# Patient Record
Sex: Male | Born: 1985 | Race: Black or African American | Hispanic: No | Smoking: Never smoker
Health system: Southern US, Community
[De-identification: ages and names within clinical notes are randomized; demographics above are authoritative.]

## PROBLEM LIST (undated history)

## (undated) DIAGNOSIS — Z789 Other specified health status: Secondary | ICD-10-CM

## (undated) DIAGNOSIS — J45909 Unspecified asthma, uncomplicated: Secondary | ICD-10-CM

## (undated) HISTORY — PX: SHOULDER SURGERY: SHX246

## (undated) HISTORY — PX: CERVICAL DISC ARTHROPLASTY: SHX587

---

## 2019-11-05 ENCOUNTER — Ambulatory Visit (HOSPITAL_COMMUNITY)
Admission: EM | Admit: 2019-11-05 | Discharge: 2019-11-05 | Disposition: A | Discharge status: Home / Self Care | Payer: No Typology Code available for payment source | Attending: Family Medicine | Admitting: Family Medicine

## 2019-11-05 ENCOUNTER — Encounter (HOSPITAL_COMMUNITY): Payer: Self-pay

## 2019-11-05 ENCOUNTER — Other Ambulatory Visit: Payer: Self-pay

## 2019-11-05 DIAGNOSIS — R110 Nausea: Secondary | ICD-10-CM | POA: Insufficient documentation

## 2019-11-05 DIAGNOSIS — R0981 Nasal congestion: Secondary | ICD-10-CM | POA: Diagnosis not present

## 2019-11-05 DIAGNOSIS — R519 Headache, unspecified: Secondary | ICD-10-CM | POA: Diagnosis present

## 2019-11-05 DIAGNOSIS — J3489 Other specified disorders of nose and nasal sinuses: Secondary | ICD-10-CM | POA: Diagnosis not present

## 2019-11-05 DIAGNOSIS — R42 Dizziness and giddiness: Secondary | ICD-10-CM | POA: Diagnosis not present

## 2019-11-05 DIAGNOSIS — Z20822 Contact with and (suspected) exposure to covid-19: Secondary | ICD-10-CM | POA: Diagnosis not present

## 2019-11-05 NOTE — ED Triage Notes (Signed)
Pt presents with ongoing headache, fatigue, nausea, and dizziness X 2 days.

## 2019-11-05 NOTE — Discharge Instructions (Addendum)
Recommend take Mucinex-D every 12 hours as needed for congestion and sinus pain. Continue Tylenol 1000mg  every 8 hours as needed for headache- may alternate with Ibuprofen 600mg  every 6 hours as needed. Continue to push fluids to help loosen up mucus in chest and head. Continue to use OTC Nasal spray for allergies/congestion. Follow-up in 3 to 4 days if not improving.

## 2019-11-05 NOTE — ED Provider Notes (Signed)
[facility]    CSN: [id number] Arrival date & time: 11/05/19  1508      History   Chief Complaint Chief Complaint  Patient presents with  . Headache  . Dizziness  . Fatigue  . Nausea    HPI Benjamin Bowen is a 34 y.o. male.   34 year old male presents with headache, sinus pressure, fatigue, dizziness, and nausea that started 2 days ago. Also having thicker clear to yellowish mucus draining from nose and down into his chest. Has decreased appetite but no vomiting or diarrhea. No change in taste or smell. Has been taking Tylenol with some relief. 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 Has received the Moderna vaccine- 2 doses. Has also been taking OTC antihistamine and nasal sprays with minimal relief. No chronic health issues. Takes no daily medication.   The history is provided by the patient.    History reviewed. No pertinent past medical history.  There are no problems to display for this patient.   Past Surgical History:  Procedure Laterality Date  . SHOULDER SURGERY         Home Medications    Prior to Admission medications   Not on File    Family History Family History  Family history unknown: Yes    Social History Social History   Tobacco Use  . Smoking status: Never Smoker  Substance Use Topics  . Alcohol use: Not on file  . Drug use: Not on file     Allergies   Patient has no known allergies.   Review of Systems Review of Systems  Constitutional: Positive for activity change, appetite change, chills and fatigue. Negative for diaphoresis and fever.  HENT: Positive for congestion, ear pain (pressure left worse than right), postnasal drip, rhinorrhea, sinus pressure and sinus pain. Negative for ear discharge, facial swelling, mouth sores, nosebleeds, sore throat and trouble swallowing.   Eyes: Positive for photophobia. Negative for pain,  discharge, redness, itching and visual disturbance.  Respiratory: Positive for cough. Negative for chest tightness, shortness of breath and wheezing.   Gastrointestinal: Positive for nausea. Negative for diarrhea and vomiting.  Musculoskeletal: Positive for myalgias. Negative for back pain, neck pain and neck stiffness.  Skin: Negative for color change, rash and wound.  Allergic/Immunologic: Positive for environmental allergies. Negative for food allergies and immunocompromised state.  Neurological: Positive for dizziness, light-headedness and headaches. Negative for tremors, seizures, syncope, facial asymmetry, weakness and numbness.  Hematological: Negative for adenopathy. Does not bruise/bleed easily.     Physical Exam Triage Vital Signs ED Triage Vitals  Enc Vitals Group     BP 11/05/19 1700 127/72     Pulse Rate 11/05/19 1700 75     Resp 11/05/19 1700 17     Temp 11/05/19 1700 99.4 F (37.4 C)     Temp Source 11/05/19 1700 Oral     SpO2 11/05/19 1700 100 %     Weight --      Height --      Head Circumference --      Peak Flow --      Pain Score 11/05/19 1657 6     Pain Loc --      Pain Edu? --      Excl. in [place]? --    No data found.  Updated Vital Signs BP 127/72 (BP Location: Left Arm)   Pulse 75  Temp 99.4 F (37.4 C) (Oral)   Resp 17   SpO2 100%   Visual Acuity Right Eye Distance:   Left Eye Distance:   Bilateral Distance:    Right Eye Near:   Left Eye Near:    Bilateral Near:     Physical Exam Vitals and nursing note reviewed.  Constitutional:      General: He is awake. He is not in acute distress.    Appearance: He is well-developed and well-groomed. He is ill-appearing.     Comments: He is sitting comfortably on the exam table in no acute distress but appears ill.   HENT:     Head: Normocephalic and atraumatic.     Right Ear: Hearing, ear canal and external ear normal. No drainage. A middle ear effusion is present. Tympanic membrane is bulging.  Tympanic membrane is not injected, erythematous or retracted.     Left Ear: Hearing, ear canal and external ear normal. No drainage. A middle ear effusion is present. Tympanic membrane is bulging. Tympanic membrane is not injected, erythematous or retracted.     Ears:     Comments: Fluid bubbles present behind both TM's- no redness.     Nose: Congestion and rhinorrhea present. Rhinorrhea is clear and purulent.     Right Turbinates: Enlarged.     Left Turbinates: Enlarged.     Right Sinus: Maxillary sinus tenderness and frontal sinus tenderness present.     Left Sinus: Maxillary sinus tenderness and frontal sinus tenderness present.     Mouth/Throat:     Lips: Pink.     Mouth: Mucous membranes are moist.     Pharynx: Uvula midline. Oropharyngeal exudate (slight clear to yellow post nasal drainage present) and posterior oropharyngeal erythema present. No pharyngeal swelling or uvula swelling.  Eyes:     Extraocular Movements: Extraocular movements intact.     Conjunctiva/sclera: Conjunctivae normal.  Cardiovascular:     Rate and Rhythm: Normal rate and regular rhythm.     Heart sounds: Normal heart sounds. No murmur.  Pulmonary:     Effort: Pulmonary effort is normal. No respiratory distress.     Breath sounds: Normal breath sounds and air entry. No stridor or decreased air movement. No decreased breath sounds, wheezing, rhonchi or rales.  Musculoskeletal:        General: Normal range of motion.     Cervical back: Normal range of motion and neck supple. No rigidity. No pain with movement or muscular tenderness.  Lymphadenopathy:     Cervical: Cervical adenopathy present.     Right cervical: Superficial cervical adenopathy present.     Left cervical: Superficial cervical adenopathy present.  Skin:    General: Skin is warm and dry.     Capillary Refill: Capillary refill takes less than 2 seconds.     Findings: No rash.  Neurological:     General: No focal deficit present.     Mental  Status: He is alert and oriented to person, place, and time.  Psychiatric:        Mood and Affect: Mood normal.        Behavior: Behavior normal. Behavior is cooperative.        Thought Content: Thought content normal.        Judgment: Judgment normal.      UC Treatments / Results  Labs (all labs ordered are listed, but only abnormal results are displayed) Labs Reviewed  SARS CORONAVIRUS 2 (TAT 6-24 HRS)    EKG  Radiology No results found.  Procedures Procedures (including critical care time)  Medications Ordered in UC Medications - No data to display  Initial Impression / Assessment and Plan / UC Course  I have reviewed the triage vital signs and the nursing notes.  Pertinent labs & imaging results that were available during my care of the patient were reviewed by me and considered in my medical decision making (see chart for details).    Reviewed with patient that he probably has a viral upper respiratory illness- possible COVID 19 but less likely due to being fully vaccinated. Headache most likely due to sinus congestion. Dizziness and nausea may be due to fluid behind tympanic membranes. Recommend take OTC Mucinex-D or similar medication to help with sinus congestion. May continue OTC antihistamine and nasal spray as directed. Increase fluids to help loosen up mucus. Continue Tylenol 1000mg  every 8 hours as needed- may alternate with Ibuprofen 600mg . Note written for work. Rest. Follow-up pending COVID 19 test results and in 3 to 4 days if not improving.   Final Clinical Impressions(s) / UC Diagnoses   Final diagnoses:  Sinus headache  Nasal congestion with rhinorrhea  Dizziness  Nausea     Discharge Instructions     Recommend take Mucinex-D every 12 hours as needed for congestion and sinus pain. Continue Tylenol 1000mg  every 8 hours as needed for headache- may alternate with Ibuprofen 600mg  every 6 hours as needed. Continue to push fluids to help loosen up mucus  in chest and head. Continue to use OTC Nasal spray for allergies/congestion. Follow-up in 3 to 4 days if not improving.     ED Prescriptions    None     PDMP not reviewed this encounter.   , NP 11/05/19 2356

## 2019-11-06 LAB — SARS CORONAVIRUS 2 (TAT 6-24 HRS): SARS Coronavirus 2: NEGATIVE

## 2019-12-10 ENCOUNTER — Other Ambulatory Visit: Payer: Self-pay | Admitting: Nurse Practitioner

## 2019-12-10 ENCOUNTER — Ambulatory Visit
Admission: RE | Admit: 2019-12-10 | Discharge: 2019-12-10 | Disposition: A | Discharge status: Home / Self Care | Payer: No Typology Code available for payment source | Source: Ambulatory Visit | Attending: Nurse Practitioner | Admitting: Nurse Practitioner

## 2019-12-10 DIAGNOSIS — Z021 Encounter for pre-employment examination: Secondary | ICD-10-CM

## 2020-01-31 ENCOUNTER — Emergency Department (HOSPITAL_COMMUNITY)
Admission: EM | Admit: 2020-01-31 | Discharge: 2020-01-31 | Disposition: A | Discharge status: Home / Self Care | Payer: No Typology Code available for payment source | Attending: Emergency Medicine | Admitting: Emergency Medicine

## 2020-01-31 ENCOUNTER — Other Ambulatory Visit: Payer: Self-pay

## 2020-01-31 ENCOUNTER — Emergency Department (HOSPITAL_COMMUNITY): Payer: No Typology Code available for payment source

## 2020-01-31 ENCOUNTER — Encounter (HOSPITAL_COMMUNITY): Payer: Self-pay | Admitting: Emergency Medicine

## 2020-01-31 DIAGNOSIS — R1030 Lower abdominal pain, unspecified: Secondary | ICD-10-CM | POA: Diagnosis not present

## 2020-01-31 DIAGNOSIS — Z5321 Procedure and treatment not carried out due to patient leaving prior to being seen by health care provider: Secondary | ICD-10-CM | POA: Insufficient documentation

## 2020-01-31 DIAGNOSIS — N50811 Right testicular pain: Secondary | ICD-10-CM | POA: Diagnosis present

## 2020-01-31 MED ORDER — TETANUS-DIPHTH-ACELL PERTUSSIS 5-2.5-18.5 LF-MCG/0.5 IM SUSP: 0.5000 mL | Freq: Once | INTRAMUSCULAR | Status: DC

## 2020-01-31 MED ORDER — AMOXICILLIN-POT CLAVULANATE 875-125 MG PO TABS: 1.0000 | ORAL_TABLET | Freq: Once | ORAL | Status: DC
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
## 2020-01-31 NOTE — ED Notes (Signed)
Pt called for room did not answer

## 2020-01-31 NOTE — ED Notes (Signed)
Patient called for room and called on personal cell phone with no response.

## 2020-03-05 ENCOUNTER — Ambulatory Visit (HOSPITAL_COMMUNITY)
Admission: EM | Admit: 2020-03-05 | Discharge: 2020-03-05 | Attending: Licensed Clinical Social Worker | Admitting: Licensed Clinical Social Worker

## 2020-03-05 ENCOUNTER — Emergency Department (HOSPITAL_COMMUNITY)
Admission: EM | Admit: 2020-03-05 | Discharge: 2020-03-07 | Disposition: A | Discharge status: Other Acute Inpatient Hospital | Payer: No Typology Code available for payment source | Attending: Emergency Medicine | Admitting: Emergency Medicine

## 2020-03-05 ENCOUNTER — Other Ambulatory Visit: Payer: Self-pay

## 2020-03-05 ENCOUNTER — Encounter (HOSPITAL_COMMUNITY): Payer: Self-pay | Admitting: Emergency Medicine

## 2020-03-05 DIAGNOSIS — Z20822 Contact with and (suspected) exposure to covid-19: Secondary | ICD-10-CM | POA: Diagnosis not present

## 2020-03-05 DIAGNOSIS — F322 Major depressive disorder, single episode, severe without psychotic features: Secondary | ICD-10-CM

## 2020-03-05 DIAGNOSIS — R45851 Suicidal ideations: Secondary | ICD-10-CM | POA: Diagnosis not present

## 2020-03-05 DIAGNOSIS — R258 Other abnormal involuntary movements: Secondary | ICD-10-CM | POA: Diagnosis not present

## 2020-03-05 DIAGNOSIS — F3289 Other specified depressive episodes: Secondary | ICD-10-CM | POA: Insufficient documentation

## 2020-03-05 DIAGNOSIS — Z046 Encounter for general psychiatric examination, requested by authority: Secondary | ICD-10-CM

## 2020-03-05 LAB — CBC
HCT: 44.3 % (ref 39.0–52.0)
Hemoglobin: 14.9 g/dL (ref 13.0–17.0)
MCH: 30 pg (ref 26.0–34.0)
MCHC: 33.6 g/dL (ref 30.0–36.0)
MCV: 89.3 fL (ref 80.0–100.0)
Platelets: 225 10*3/uL (ref 150–400)
RBC: 4.96 MIL/uL (ref 4.22–5.81)
RDW: 11.6 % (ref 11.5–15.5)
WBC: 8.2 10*3/uL (ref 4.0–10.5)
nRBC: 0 % (ref 0.0–0.2)

## 2020-03-05 LAB — COMPREHENSIVE METABOLIC PANEL
ALT: 29 U/L (ref 0–44)
AST: 23 U/L (ref 15–41)
Albumin: 4.4 g/dL (ref 3.5–5.0)
Alkaline Phosphatase: 41 U/L (ref 38–126)
Anion gap: 12 (ref 5–15)
BUN: 9 mg/dL (ref 6–20)
CO2: 27 mmol/L (ref 22–32)
Calcium: 9.4 mg/dL (ref 8.9–10.3)
Chloride: 101 mmol/L (ref 98–111)
Creatinine, Ser: 1.16 mg/dL (ref 0.61–1.24)
GFR calc Af Amer: >60 mL/min (ref >60)
GFR calc non Af Amer: >60 mL/min (ref >60)
Glucose, Bld: 99 mg/dL (ref 70–99)
Potassium: 3.8 mmol/L (ref 3.5–5.1)
Sodium: 140 mmol/L (ref 135–145)
Total Bilirubin: 1.7 mg/dL — ABNORMAL HIGH (ref 0.3–1.2)
Total Protein: 7.5 g/dL (ref 6.5–8.1)

## 2020-03-05 LAB — SALICYLATE LEVEL: Salicylate Lvl: <7 mg/dL — ABNORMAL LOW (ref 7.0–30.0)

## 2020-03-05 LAB — ETHANOL: Alcohol, Ethyl (B): <10 mg/dL (ref <10)

## 2020-03-05 LAB — ACETAMINOPHEN LEVEL: Acetaminophen (Tylenol), Serum: <10 ug/mL — ABNORMAL LOW (ref 10–30)
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
## 2020-03-05 NOTE — BH Assessment (Addendum)
Comprehensive Clinical Assessment (CCA) Note  03/05/2020 Benjamin Bowen [id number]  Visit Diagnosis:   MDD, recurrent, severe without sx of psychosis Disposition:[name], NP recommends IVC and inpt psychiatric tx  Pt presents to [facility] reporting thoughts of suicide following a break- up with his fiancee and disappointment of being disqualified from the [employer] due to anxiety attacks during training. Pt reports multiple symptoms of depression. Pt was petitioned for IVC due to reporting his guns were no longer in his home & that they were removed by his fiancee's father for pt's safety. Collateral phone contact was made with person pt had designated and verbally authorized for collateral contact (his ex-fiancee [name], [phone]) . Ms. Sheliah Hatch reports she is concerned for pt's safety from self-harm. She reports pt "absolutely has a plan and needs as much help as he can get." She reports pt made threats to shoot himself and held his handgun to his head. Pt reports/confirms he did receive the guns back in his home because he needed them for self-protection. Collateral reports shot gun was removed from pt's home today but they were not able to locate the handgun.   CCA Screening, Triage and Referral (STR)  Patient Reported Information How did you [place] about [place] [facility]  Referral name: [place]  What Is the Reason for Your Visit/Call Today? Depression sx and suicidal ideation following break up with fiancee & being disqualified from [employer] due to panic attacks  How Long Has This Been Causing You Problems? 1-6 months  What Do You Feel Would Help You the Most Today? Medication;Therapy   Have You Recently Been in Any Inpatient Treatment (Hospital/Detox/Crisis Center/28-Day Program)? No  Have You Ever Received Services From [employer] Before? Yes  Have You Recently Had Any Thoughts About Hurting Yourself? Yes  Are You Planning to  Commit Suicide/Harm Yourself At This time? No   Have you Recently Had Thoughts About Hurting Someone Karolee Ohs? No   Have You Used Any Alcohol or Drugs in the Past 24 Hours? Yes  How Long Ago Did You Use Drugs or Alcohol? 1200  What Did You Use and How Much? noon today. Pt reports drinking 3-4 x weekly; a pint to a fifth of liquor   Do You Currently Have a Therapist/Psychiatrist? No  Have You Been Recently Discharged From Any Office Practice or Programs? No  CCA Screening Triage Referral Assessment Type of Contact: Face-to-Face   Patient Reported Information Reviewed? Yes   Collateral Involvement: Luvenia Starch (ex-fiancee) [phone]  Is CPS involved or ever been involved? Never  Is APS involved or ever been involved? Never   Patient Determined To Be At Risk for Harm To Self or Others Based on Review of Patient Reported Information or Presenting Complaint? Yes, for Self-Harm   Location of Assessment: [facility]   Does Patient Present under Involuntary Commitment? No  [place] of Residence: [place]  Determination of Need: Emergent (2 hours)   Options For Referral: Outpatient Therapy;Medication Management;Inpatient Hospitalization   CCA Biopsychosocial  Intake/Chief Complaint:  CCA Intake With Chief Complaint CCA Part Two Date: 03/05/20 CCA Part Two Time: 1920 Chief Complaint/Presenting Problem: Pt presents to [facility] reporting thoughts of suicide following a break-up with his fiancee and disappointment of being disqualified from the [employer] due to anxiety attacks during training Patient's Currently Reported Symptoms/Problems: depression sx; suicide ideation Individual's Strengths: supportive family (mother, sisters) Type of Services Patient Feels Are Needed:  med mngt; therapy  Mental Health Symptoms Depression:  Depression: Change in energy/activity, Difficulty Concentrating, Fatigue, Hopelessness,  Increase/decrease in appetite, Irritability, Sleep (too much or little), Tearfulness, Worthlessness, Duration of symptoms greater than two weeks  Mania:     Anxiety:   Anxiety: Difficulty concentrating, Irritability, Tension, Worrying, Sleep, Fatigue, Restlessness  Psychosis:  Psychosis: None  Trauma:  Trauma: N/A  Obsessions:  Obsessions: N/A  Compulsions:  Compulsions: N/A  Inattention:  Inattention: N/A  Hyperactivity/Impulsivity:  Hyperactivity/Impulsivity: N/A  Oppositional/Defiant Behaviors:  Oppositional/Defiant Behaviors: N/A  Emotional Irregularity:  Emotional Irregularity: N/A  Other Mood/Personality Symptoms:      Mental Status Exam Appearance and self-care  Stature:  Stature: Average  Weight:  Weight: Average weight  Clothing:  Clothing: Casual  Grooming:  Grooming: Normal  Cosmetic use:  Cosmetic Use: None  Posture/gait:  Posture/Gait: Tense, Normal  Motor activity:  Motor Activity: Restless, Not Remarkable  Sensorium  Attention:  Attention: Normal  Concentration:  Concentration: Preoccupied  Orientation:  Orientation: X5  Recall/memory:  Recall/Memory: Normal  Affect and Mood  Affect:  Affect: Anxious, Appropriate  Mood:  Mood: Anxious, Angry, Irritable  Relating  Eye contact:  Eye Contact: Normal  Facial expression:  Facial Expression: Angry, Anxious, Tense, Sad, Responsive  Attitude toward examiner:  Attitude Toward Examiner: Cooperative  Thought and Language  Speech flow: Speech Flow: Clear and Coherent  Thought content:  Thought Content: Appropriate to Mood and Circumstances  Preoccupation:  Preoccupations: Guilt  Hallucinations:  Hallucinations: None  Organization:     Company secretary of Knowledge:  Fund of Knowledge: Good  Intelligence:  Intelligence: Average  Abstraction:  Abstraction: Normal  Judgement:  Judgement: Impaired  Reality Testing:  Reality Testing: Variable  Insight:  Insight: Poor  Decision Making:  Decision Making: Impulsive   Social Functioning  Social Maturity:  Social Maturity: Impulsive, Isolates, Responsible, Irresponsible  Social Judgement:  Social Judgement: Heedless, Normal, Victimized  Stress  Stressors:  Stressors: Family conflict, Grief/losses, Relationship  Coping Ability:  Coping Ability: Exhausted, Engineer, agricultural Deficits:  Skill Deficits: Decision making  Supports:  Supports: Family    Exercise/Diet: Exercise/Diet Do You Have Any Trouble Sleeping?: Yes Explanation of Sleeping Difficulties: hasn't slept well since deployment   CCA Employment/Education  Employment/Work Situation: Employment / Work Psychologist, occupational Employment situation:  ([employer] & recently disqualified from [employer] for panic attack) Has patient ever been in the [employer]?: Yes (Describe in comment)  Education: Education Is Patient Currently Attending School?: No   CCA Family/Childhood History  Family and Relationship History: Family history Are you sexually active?: Yes What is your sexual orientation?: hetero Does patient have children?: Yes How many children?: 3 How is patient's relationship with their children?: close  Childhood History:  Childhood History Does patient have siblings?: Yes Did patient suffer any verbal/emotional/physical/sexual abuse as a child?: No Did patient suffer from severe childhood neglect?: No Has patient ever been sexually abused/assaulted/raped as an adolescent or adult?: No Was the patient ever a victim of a crime or a disaster?: No  Child/Adolescent Assessment:     CCA Substance Use  Alcohol/Drug Use: Alcohol / Drug Use Pain Medications: denies History of alcohol / drug use?: Yes Substance #1 Name of Substance 1: alcohol 1 - Amount (size/oz): pint to a fifth of liquor 1 - Frequency: 3-4 x weekly 1 - Last Use / Amount: noon 03/05/2020    Keshara Kiger Suzan Nailer

## 2020-03-05 NOTE — ED Notes (Signed)
Pt doesn't want to change into purple scrubs or give up belongings.

## 2020-03-05 NOTE — Progress Notes (Signed)
Patients mother called inquiring about information after he has been seen.  Marva Panda, [phone]  Ladoris Gene MSW,LCSWA,LCASA Clinical Social Worker  [place] Disposition, CSW [phone] (cell)

## 2020-03-05 NOTE — ED Notes (Signed)
Pt still has his phone

## 2020-03-05 NOTE — ED Notes (Signed)
Pt served with IVC paperwork by [facility] & escorted from building. Pt calm & cooperative at that time but c/o being 'held against his will'.

## 2020-03-05 NOTE — ED Notes (Signed)
Patient belongings in locker # 17. 

## 2020-03-05 NOTE — ED Triage Notes (Signed)
Pt brought into ED under IVC by [employer].  Pt states he went for help and they IVCed him.  See IVC paperwork for further information.  Belonging were taken, pt was changed and wonded by security.  Pt is now calm and cooperative w/ staff.

## 2020-03-06 LAB — RESPIRATORY PANEL BY RT PCR (FLU A&B, COVID)
Influenza A by PCR: NEGATIVE
Influenza B by PCR: NEGATIVE
SARS Coronavirus 2 by RT PCR: NEGATIVE

## 2020-03-06 NOTE — ED Provider Notes (Signed)
[facility] EMERGENCY DEPARTMENT Provider Note   CSN: [id number] Arrival date & time: 03/05/20  2130     History Chief Complaint  Patient presents with  . IVC    Benjamin Bowen is a 34 y.o. male.  The history is provided by the patient and medical records.    34 y.o. M presenting to the ED from [facility] under IVC.  He apparently called support team earlier today and requested transport to facility for help.  Patient has had a lot of stress recently-- broken up with fiance and was released from the [employer] due to anxiety attacks during training.  He admits he is disappointed in himself.    Per IVC paperwork, he had guns in the home but they were removed earlier this week by fiance's father for safety.  Fiance did report earlier today he made threats of wanted to shoot himself and held a handgun to his head.  He has never made any suicidal statements or attempts on his life in the past.  Does drink alcohol regularly including earlier today, denies illicit drug use.    Patient has been recommended for IP treatment.  Sent here for medical clearance and is pending placement.  History reviewed. No pertinent past medical history.  There are no problems to display for this patient.   Past Surgical History:  Procedure Laterality Date  . SHOULDER SURGERY         Family History  Family history unknown: Yes    Social History   Tobacco Use  . Smoking status: Never Smoker  Substance Use Topics  . Alcohol use: Not on file  . Drug use: Not on file    Home Medications Prior to Admission medications   Not on File    Allergies    Patient has no known allergies.  Review of Systems   Review of Systems  Psychiatric/Behavioral: Positive for suicidal ideas.  All other systems reviewed and are negative.   Physical Exam Updated Vital Signs BP 138/84 (BP Location: Right Arm)   Pulse 72   Temp 97.9 F (36.6 C) (Oral)   Resp (!) 24   SpO2 100%    Physical Exam Vitals and nursing note reviewed.  Constitutional:      Appearance: He is well-developed.     Comments: Sleeping with blanket over head  HENT:     Head: Normocephalic and atraumatic.  Eyes:     Conjunctiva/sclera: Conjunctivae normal.     Pupils: Pupils are equal, round, and reactive to light.  Cardiovascular:     Rate and Rhythm: Normal rate and regular rhythm.     Heart sounds: Normal heart sounds.  Pulmonary:     Effort: Pulmonary effort is normal.     Breath sounds: Normal breath sounds.  Abdominal:     General: Bowel sounds are normal.     Palpations: Abdomen is soft.  Musculoskeletal:        General: Normal range of motion.     Cervical back: Normal range of motion.  Skin:    General: Skin is warm and dry.  Neurological:     Mental Status: He is alert and oriented to person, place, and time.  Psychiatric:     Comments: Seems depressed, limited eye contact     ED Results / Procedures / Treatments   Labs (all labs ordered are listed, but only abnormal results are displayed) Labs Reviewed  COMPREHENSIVE METABOLIC PANEL - Abnormal; Notable for the following components:  Result Value   Total Bilirubin 1.7 (*)    All other components within normal limits  SALICYLATE LEVEL - Abnormal; Notable for the following components:   Salicylate Lvl <7.0 (*)    All other components within normal limits  ACETAMINOPHEN LEVEL - Abnormal; Notable for the following components:   Acetaminophen (Tylenol), Serum <10 (*)    All other components within normal limits  RESPIRATORY PANEL BY RT PCR (FLU A&B, COVID)  ETHANOL  CBC  RAPID URINE DRUG SCREEN, HOSP PERFORMED    EKG None  Radiology No results found.  Procedures Procedures (including critical care time)  Medications Ordered in ED Medications - No data to display  ED Course  I have reviewed the triage vital signs and the nursing notes.  Pertinent labs & imaging results that were available during  my care of the patient were reviewed by me and considered in my medical decision making (see chart for details).    MDM Rules/Calculators/A&P  34 year old male presenting to the ED under IVC petitioned by staff at [facility].  Recent stressors including break up with fiance and being released from [employer] due to anxiety attacks.  He reportedly had a gun to his head earlier today and made suicidal statements to fiance.  He is calm/cooperative here. No medical concerns currently, labs reassuring.  RT panel is negative.  Medically cleared.  Awaiting IP psychiatric placement.  Holding orders in place.  Final Clinical Impression(s) / ED Diagnoses Final diagnoses:  Involuntary commitment    Rx / DC Orders ED Discharge Orders    None       Garlon Hatchet, PA-C 03/06/20 0531    Gilda Crease, MD 03/06/20 [phone]

## 2020-03-06 NOTE — Progress Notes (Signed)
[facility] requested additional information on patient and is considering accepting him.  Faxed additional information and will follow up.  Ladoris Gene MSW,LCSWA,LCASA Clinical Social Worker  [place] Disposition, CSW [phone] (cell)

## 2020-03-06 NOTE — ED Notes (Signed)
Patient using telephone  

## 2020-03-06 NOTE — Progress Notes (Signed)
Pt accepted to [facility] [address] [place], [place] [address]  Dr. Estill Cotta is the attending provider.    Call report to [phone]    Jacalyn Lefevre @ [facility]  ED notified.     Pt is IVC.    Pt may be transported by law enforcement   Pt scheduled  to arrive at [facility], Saturday 9/24 anytime after 7am.   Ladoris Gene MSW,LCSWA,LCASA Clinical Social Worker  [facility] Disposition, CSW [phone] (cell)

## 2020-03-06 NOTE — ED Provider Notes (Signed)
Emergency Medicine Observation Re-evaluation Note  Benjamin Bowen is a 34 y.o. male, seen on rounds today.  Pt initially presented to the ED for complaints of IVC Currently, the patient is calm and cooperative.  Physical Exam  BP 125/74 (BP Location: Right Arm)   Pulse 62   Temp 98.5 F (36.9 C) (Oral)   Resp 18   Ht 5\' 9"  (1.753 m)   Wt 90.7 kg   SpO2 98%   BMI 29.53 kg/m  Physical Exam  No diaphoresis.  No pallor.  Pulmonary: No increased work of breathing.  Speaks in full sentences without difficulty. No tachypnea.   Cardiac: Normal rate and regular.   Neurologic: Alert and oriented.  Moves all extremities without noted difficulty.  Psych: Calm and cooperative throughout several interactions with the patient.  ED Course / MDM  EKG:    I have reviewed the labs performed to date as well as medications administered while in observation.  No notable changes over the last 24 hours except patient appears to be more cooperative.  Plan  Current plan is for inpatient psych management. Patient is under full IVC at this time.   This patient required extensive review of notes of his intake as well as multiple interviews. At first glance, it appears as though patient was a walk-in at [facility] last night complaining of depression and need for counseling.  During the course of the interview the provider became suspicious that the patient may be a danger to himself, recommended inpatient management and placed the patient under IVC. Patient was then transferred via law enforcement to [facility] ED.  When I interviewed the patient, he seemed rather reasonable.  He communicated details of the key event that he thinks resulted in his IVC.  He states he was target shooting when he received a video chat from his significant other.  She delivered bad news to him during this conversation and patient still had the handgun in his hand.  He put his hands to either side of his head in a gesture of  frustration and emotion similar to holding his head in his hands.  He states because he was still holding handgun it looked as if he was placing the handgun to his head.    5:22 PM: I was able to speak with [facility], NP who interviewed the patient last night and placed him under IVC.  I wanted to see if she thought it would be reasonable to reevaluate the patient's IVC.  Inetta Fermo is quite hesitant based on her assessment last night. She reports patient was quite distraught and has so many risk factors for suddenly worsening depression and not only suicide, but suicide completion, that she was acutely concerned for his safety. He apparently ran from the facility and had to be chased by Inetta Fermo. She states if we want patient reevaluated, it might be best to have him reevaluated by a second provider after shift change at 8 pm.  8:50 PM: Spoke with Patent examiner, NP with [facility]. Discussed patient presentation. She states she thinks it would more appropriate for patient to be reevaluated during psych rounds in the morning.        [facility] 03/06/20 2232    2233, MD 03/07/20 03/09/20

## 2020-03-06 NOTE — Progress Notes (Signed)
Per Berneice Heinrich, NP patient meets inpatient criteria.  Following facilities have been contacted:    [facility]        [facility]        [facility]        [facility]        [facility]        [facility]        [facility]        [facility]        [facility]        [facility]        [facility]        [facility]        [facility]        [facility] (after [facility])        [facility]        [facility]       CSW will continue to follow up.  Ladoris Gene MSW,LCSWA,LCASA Clinical Social Worker  [place] Disposition, CSW [phone] (cell)

## 2020-03-06 NOTE — ED Notes (Signed)
While during inventory on patient belonging sealed plastic bag noted with rip in it. Took belongings to security and inventoried it in with them. Went over belonging sheet with patient and he agreed that everything was correct in the  bag.

## 2020-03-06 NOTE — ED Notes (Signed)
Sitter at bedside.

## 2020-03-07 ENCOUNTER — Other Ambulatory Visit: Payer: Self-pay

## 2020-03-07 ENCOUNTER — Encounter (HOSPITAL_COMMUNITY): Payer: Self-pay | Admitting: Psychiatry

## 2020-03-07 ENCOUNTER — Inpatient Hospital Stay (HOSPITAL_COMMUNITY)
Admission: AD | Admit: 2020-03-07 | Discharge: 2020-03-09 | DRG: 881 | Disposition: A | Discharge status: Home / Self Care | Attending: Psychiatry | Admitting: Psychiatry

## 2020-03-07 DIAGNOSIS — F329 Major depressive disorder, single episode, unspecified: Secondary | ICD-10-CM | POA: Diagnosis present

## 2020-03-07 DIAGNOSIS — F1220 Cannabis dependence, uncomplicated: Secondary | ICD-10-CM | POA: Diagnosis present

## 2020-03-07 DIAGNOSIS — G4700 Insomnia, unspecified: Secondary | ICD-10-CM | POA: Diagnosis present

## 2020-03-07 DIAGNOSIS — R45851 Suicidal ideations: Secondary | ICD-10-CM | POA: Diagnosis present

## 2020-03-07 DIAGNOSIS — F322 Major depressive disorder, single episode, severe without psychotic features: Secondary | ICD-10-CM

## 2020-03-07 DIAGNOSIS — Z20822 Contact with and (suspected) exposure to covid-19: Secondary | ICD-10-CM | POA: Diagnosis present

## 2020-03-07 HISTORY — DX: Other specified health status: Z78.9

## 2020-03-07 LAB — RAPID URINE DRUG SCREEN, HOSP PERFORMED
Amphetamines: NOT DETECTED
Barbiturates: NOT DETECTED
Benzodiazepines: NOT DETECTED
Cocaine: NOT DETECTED
Opiates: NOT DETECTED
Tetrahydrocannabinol: POSITIVE — AB

## 2020-03-07 MED ORDER — ACETAMINOPHEN 325 MG PO TABS
650.0000 mg | ORAL_TABLET | Freq: Four times a day (QID) | ORAL | Status: DC | PRN
  Administered 2020-03-07 – 2020-03-09 (×4): 650 mg via ORAL
  Filled 2020-03-07 (×4): qty 2

## 2020-03-07 MED ORDER — TRAZODONE HCL 50 MG PO TABS
50.0000 mg | ORAL_TABLET | Freq: Every evening | ORAL | Status: DC | PRN
  Administered 2020-03-07 – 2020-03-08 (×2): 50 mg via ORAL
  Filled 2020-03-07 (×2): qty 1

## 2020-03-07 MED ORDER — MAGNESIUM HYDROXIDE 400 MG/5ML PO SUSP: 30.0000 mL | Freq: Every day | ORAL | Status: DC | PRN

## 2020-03-07 MED ORDER — BUPROPION HCL ER (XL) 150 MG PO TB24
150.0000 mg | ORAL_TABLET | Freq: Every day | ORAL | Status: DC
  Administered 2020-03-07 – 2020-03-09 (×3): 150 mg via ORAL
  Filled 2020-03-07 (×6): qty 1

## 2020-03-07 MED ORDER — ALUM & MAG HYDROXIDE-SIMETH 200-200-20 MG/5ML PO SUSP: 30.0000 mL | ORAL | Status: DC | PRN

## 2020-03-07 NOTE — Progress Notes (Signed)
°   03/07/20 2225  COVID-19 Daily Checkoff  Have you had a fever (temp > 37.80C/100F)  in the past 24 hours?  No  If you have had runny nose, nasal congestion, sneezing in the past 24 hours, has it worsened? No  COVID-19 EXPOSURE  Have you traveled outside the state in the past 14 days? No  Have you been in contact with someone with a confirmed diagnosis of COVID-19 or PUI in the past 14 days without wearing appropriate PPE? No  Have you been living in the same home as a person with confirmed diagnosis of COVID-19 or a PUI (household contact)? No  Have you been diagnosed with COVID-19? No

## 2020-03-07 NOTE — Progress Notes (Signed)
   03/07/20 2250  Psych Admission Type (Psych Patients Only)  Admission Status Involuntary  Psychosocial Assessment  Patient Complaints Insomnia  Eye Contact Fair  Facial Expression Anxious  Affect Appropriate to circumstance  Speech Logical/coherent  Interaction Assertive  Motor Activity Other (Comment) (WDL)  Appearance/Hygiene Unremarkable  Behavior Characteristics Appropriate to situation  Mood Anxious;Pleasant  Thought Process  Coherency WDL  Content WDL  Delusions None reported or observed  Perception WDL  Hallucination None reported or observed  Judgment Impaired  Confusion None  Danger to Self  Current suicidal ideation? Denies  Danger to Others  Danger to Others None reported or observed

## 2020-03-07 NOTE — BHH Counselor (Signed)
Adult Comprehensive Assessment  Patient ID: Benjamin Bowen, male   DOB: 09/22/1985, 34 y.o.   MRN: [id number]  Information Source: Information source: Patient  Current Stressors:  Patient states their primary concerns and needs for treatment are:: Struggling with feelings of guilt and selfishness about breaking his family that his newborn son came into, breaking his ex-fiancee's trust and heart.  Had a panic attack during [employer] and was forced to resign. Patient states their goals for this hospitilization and ongoing recovery are:: Accept what he did, the results of what he did, deal with his guilt and know he is not alone. Educational / Learning stressors: Denies stressors - intends to make this a priority Employment / Job issues: Has to figure out his next move, since he had to resign from the [employer] 9 days ago. Family Relationships: Very stressful - Ex-fiancee and relationships he has to build part-time with his children. Financial / Lack of resources (include bankruptcy): Can easily become stressful if does not find income before too long. Housing / Lack of housing: Denies stressors, but does miss his family that he had always anticipated sharing the home with. Physical health (include injuries & life threatening diseases): Getting older, has been having neck & back problems and just found out he has degenerative disk disease. Social relationships: Because of his shame, he is hesitant to ask for help from his friends for what he is going through. Substance abuse: Denies stressors, had not smoked marijuana for years but did smoke the day he had to resign from the [employer].  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  Loss of relationship with ex-fiancee and children.  Living/Environment/Situation:  Living Arrangements: Alone Living conditions (as described by patient or guardian): Good, but everything reminds him of  family that moved out Who else lives in the home?: Nobody How long has patient lived in current situation?: Alone for the last 2 months What is atmosphere in current home: Temporary (Likely to get out of lease and move back with mother in [place])  Family History:  Marital status: Long term relationship Long term relationship, how long?: 3 years What types of issues is patient dealing with in the relationship?: He cheated on ex-fiancee during her pregnancy, so she left him. Additional relationship information: Was previously married for 10 years Are you sexually active?: Yes What is your sexual orientation?: heterosexual Does patient have children?: Yes How many children?: 3 How is patient's relationship with their children?: 9yo daughter - loving relationship, but recently has only talked to her 4 times in 1-1/2 years, when he returned from deployment her hair was falling out and she had body odor, has court on 10/1 about custody; 4yo son - good relationship, gets to see him frequently, good working with his ex-wife on seeing the child; 58 month old son with ex-fiancee - alternating days currently  Childhood History:  By whom was/is the patient raised?: Both parents, Mother/father and step-parent, Grandparents, Other (Comment) Additional childhood history information: Both parents until age 89yo when father committed murder, went to prison.  Patient moved with aunts and grandma for awhile while mother was unable to care for him.  Rejoined mother at age 79yo, she remarried. Description of patient's relationship with caregiver when they were a child: Mother - no issues; Father - best friend, always wanted to be with him, after his incarceration really missed him, found out what he had  not known that the father was an alcoholic and use drugs; Stepfather - struggled initially, but good relationship eventually. Patient's description of current relationship with people who raised him/her: Mother - 2nd  closest person in patient's life; Father - out of prison in January 2020, different relationship now but good, more brotherly than parent/child, more open and honest; Stepfather - okay relationship, supportive How were you disciplined when you got in trouble as a child/adolescent?: Spanked, possessions lost, never felt abused Does patient have siblings?: Yes Number of Siblings: 6 Description of patient's current relationship with siblings: 3 sisters, 2 half-brothers, 1 step-brother (close but not daily contact) Did patient suffer any verbal/emotional/physical/sexual abuse as a child?: No Did patient suffer from severe childhood neglect?: No Has patient ever been sexually abused/assaulted/raped as an adolescent or adult?: No Was the patient ever a victim of a crime or a disaster?: No Witnessed domestic violence?: No Has patient been affected by domestic violence as an adult?: No  Education:  Highest grade of school patient has completed: 2 years in college Currently a student?: No Learning disability?: No  Employment/Work Situation:   Employment situation: Unemployed Patient's job has been impacted by current illness: Yes Describe how patient's job has been impacted: Panic attacks made him have to resign from the [employer]. What is the longest time patient has a held a job?: 4 years Where was the patient employed at that time?: [employer] Has patient ever been in the [employer]?: Yes (Describe in comment) Electronics engineer - since 03/2016)  Financial Resources:   Financial resources: No income, Geologist, engineering)  Alcohol/Substance Abuse:   What has been your use of drugs/alcohol within the last 12 months?: Used marijuana one time recently.  Social drinker.  States that he "vapes way more than I should." Alcohol/Substance Abuse Treatment Hx: Denies past history Has alcohol/substance abuse ever caused legal problems?: No  Social Support System:   Patient's Community  Support System: Good Describe Community Support System: Mother, 3 friends, ex-fiancee Type of faith/religion: [employer] Kn[employer] How does patient's faith help to cope with current illness?: A lot better lately, wants to rededicate himself  Leisure/Recreation:   Do You Have Hobbies?: Yes Leisure and Hobbies: Spending time with kids, drawing, writing, outdoorsman, horses, ATV'ing, shooting guns, grilling out, reading, science  Strengths/Needs:   What is the patient's perception of their strengths?: Communication, selflessness, taking care of others, loving, outgoing, fun to be around, loves being a father (greatest strength), loves family Patient states they can use these personal strengths during their treatment to contribute to their recovery: Focus on stengths instead of on his weaknesses, especially children.  Prioritize family. Patient states these barriers may affect/interfere with their treatment: None Patient states these barriers may affect their return to the community: None Other important information patient would like considered in planning for their treatment: None  Discharge Plan:   Currently receiving community mental health services: Yes (From Whom) Alferd P[facility]e at [facility] for therapy (recently had first session), received Sertraline prescription from regular doctor in [facility] [place], now has a new doctor at [place] but has not talked to him about his depression and anxiety.) Patient states concerns and preferences for aftercare planning are: Would like to have weekly therapy (will request from [place] at the [place]); not sure if he will be on medication. Patient states they will know when they are safe and ready for discharge when: Feels ready now Does patient have access to transportation?: Yes Does patient have financial  barriers related to discharge medications?: Yes Patient description of barriers related to discharge medications: No income currently; however, has [employer] and is associated with the [employer] Will patient be returning to same living situation after discharge?: Yes  Summary/Recommendations:   Summary and Recommendations (to be completed by the evaluator): Patient is a 34yo male admitted under IVC with suicidal thoughts following a break-up with fiance and being disqualified from [employer] during training due to panic attacks.  Ex-fiancee [name] [phone] reported that patient held a gun to his head after threatening to shoot himself.  Guns had been removed from the home at one point, but then were returned, have now been removed again by ex-fiancee's father and patient's mother.    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  Patient is in the [employer] and recently returned from a deployment, went through a divorce with 4yo son's mother, and then compromised his relationship with now-ex.  He is established with [facility] and has been seeing therapist Alferd Patee, would like to expand those visits to be weekly.  He has been started on Wellbutrin and needs medication management follow-up at the [place] as well.  He denies substance abuse, smoked marijuana one time recently but is not a frequent smoker and drinks socially.  Patient will benefit from crisis stabilization, medication evaluation, group therapy and psychoeducation, in addition to case management for discharge planning.  At discharge it is recommended that Patient adhere to the established discharge plan and continue in treatment.  Lynnell Chad. 03/07/2020

## 2020-03-07 NOTE — ED Notes (Signed)
Belongings in locker #2 

## 2020-03-07 NOTE — Progress Notes (Signed)
   03/07/20 2243  COVID-19 Daily Checkoff  Have you had a fever (temp > 37.80C/100F)  in the past 24 hours?  No  If you have had runny nose, nasal congestion, sneezing in the past 24 hours, has it worsened? No  COVID-19 EXPOSURE  Have you traveled outside the state in the past 14 days? No  Have you been in contact with someone with a confirmed diagnosis of COVID-19 or PUI in the past 14 days without wearing appropriate PPE? No  Have you been living in the same home as a person with confirmed diagnosis of COVID-19 or a PUI (household contact)? No  Have you been diagnosed with COVID-19? No

## 2020-03-07 NOTE — Progress Notes (Signed)
Pt is a IVC'd 34 y.o. male presenting from [facility] to [facility] for SI and depression. 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 Pt said that lately he has been balling up his emotions. He said he has been feeling frustrated and aggravated. Pt said that he had been bottling up how he felt and had reached out to someone that wasn't clinical. He ended up going to the [facility]. Pt denies having SI/HI and AVH. Denies any previous suicide attempts or thoughts. His ex-fiance thought that he was suicidal when they were face timing because when she had delivered some bad news to him, he had a pistol in his hand. He had put both of his hands on his head in frustration. To his ex-fiance, it looked like he was putting the pistol to his head.  Pt has a 9 year old daughter, 69 year old son, and 32 month old that he loves dearly. He has a court date on October 1st for his 33 year old daughters custody case and wants to be discharged before then. Pt said that he had to wait 1 year to get a court date. 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 He stays alone and lives close to [facility] hospital. His ex-fiance was still living with him for a short period of time even after they had ended their relationship. Pt said that his ex-fiance is still very supportive of him and caring. Identifies his support as his Mom, Byrd Hesselbach "ex-fiance," and Maria's father. [facility] and [facility] that he has a hard time seeing due to wait times. Would like to see someone 1-2 times a week.  His goals are to work on developing "coping strategies" and the "ability to manage his stress" along with feeling overwhelmed.   Unit  rules/policies discussed with pt. Items allowed on unit and contraband items discussed with pt. Belongings searched and contraband secured in assigned locker. Consents signed and bill of rights provided. Opportunity to ask questions provided. Skin assessment completed, no abnormal findings. Food/fluids offered. Unit tour provided. Q 15 min safety checks continue. Pt's safety has been maintained.

## 2020-03-07 NOTE — Progress Notes (Signed)
[facility] Group Notes:  (Nursing/MHT/Case Management/Adjunct)  Date:  03/07/2020  Time:  2030  Type of Therapy:  wrap up group  Participation Level:  Active  Participation Quality:  Appropriate, Attentive, Sharing and Supportive  Affect:  Appropriate and Resistant  Cognitive:  Appropriate  Insight:  Improving  Engagement in Group:  Engaged  Modes of Intervention:  Clarification, Education and Support  Summary of Progress/Problems: Positive thinking and positive change were discussed.   Marcille Buffy 03/07/2020, 9:29 PM

## 2020-03-07 NOTE — BHH Counselor (Signed)
Benjamin Bowen, [facility] at [facility], said bed 302-1 is available at this time. Pt is being admitted to the service of Dr. Jola Babinski. Notified EDP and Christella Scheuermann, RN of acceptance. Contacted [facility] and cancelled transfer to that facility.   Pamalee Leyden, [facility], [facility] Triage Specialist [phone]

## 2020-03-07 NOTE — H&P (Addendum)
[facility] Psychiatric Admission Assessment Adult  Patient Identification: Benjamin Bowen MRN:  [id number] Date of Evaluation:  03/07/2020 Chief Complaint:  MDD (major depressive disorder) [F32.9] Principal Diagnosis: MDD (major depressive disorder) Diagnosis:  Principal Problem:   MDD (major depressive disorder)  History of Present Illness: Male  [place] Veteran admitted for severe depression and suicidal thought to the unit yesterday evening.  He was seen this morning for admission assessment and he participated willingly and fully.  He was feeling suicidal after breaking up with his fiance and states he feels ashamed because what happened between them is his fault.  He called to speak to somebody and after their discussion he admitted suicide intent and the person called police to go do a wellness check.  Patient was  then brought to [facility] health Urgent care by [employer].  Patient was then taken to [facility] ER for further evaluation and then brought to the unit.  This is his first inpatient Psychiatric unit hospitalization.  Veteran admits to previous counseling starting from age 34 when his father murdered somebody.  He is still in the [employer] and was [place] and getting counseling at [facility] moved to [place] recently and is planning to establish care at [facility].  Veteran reports poor sleep, low motivation and poor energy, isolating and feelings of sadness.  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   He had a gun on the day he broke up with his girl friend. Ex- fiance has been supportive since he came to the hospital.  Thomasene Ripple does not want medications but want weekly face to face counseling.  He tried Trazodone for sleep in [place] and stopped because it made him Groggy.  He tried Sertraline in the past and did not like the way he felt.  Today he declined medication but want to engage in  group activities.  [facility] [place] [facility].  He denies suicide ideation, no previous attempt and no Psychotic symptoms.    We will prescribed Wellbutrin for depression which Patient agrees with the Psychiatrist to try. Associated Signs/Symptoms: Depression Symptoms:  depressed mood, anhedonia, insomnia, fatigue, disturbed sleep, decreased appetite, Duration of Depression Symptoms: No data recorded (Hypo) Manic Symptoms:  na Anxiety Symptoms:  Excessive Worry, Psychotic Symptoms:  na Duration of Psychotic Symptoms: No data recorded PTSD Symptoms: NA Total Time spent with patient: 45 minutes  Past Psychiatric History: Depression, was receiving counseling at [facility]  Is the patient at risk to self? No.  Has the patient been a risk to self in the past 6 months? No.  Has the patient been a risk to self within the distant past? No.  Is the patient a risk to others? No.  Has the patient been a risk to others in the past 6 months? No.  Has the patient been a risk to others within the distant past? No.   Prior Inpatient Therapy:   Prior Outpatient Therapy:   In [place]  through  [place]  Alcohol Screening: 1. How often do you have a drink containing alcohol?: 2 to 3 times a week 2. How many drinks containing alcohol do you have on a typical day when you are drinking?: 1 or 2 3. How often do you have six or more drinks on one occasion?: Less than monthly AUDIT-C Score: 4 4. How often during the last  year have you found that you were not able to stop drinking once you had started?: Never 5. How often during the last year have you failed to do what was normally expected from you because of drinking?: Never 6. How often during the last year have you needed a first drink in the morning to get yourself going after a heavy drinking session?: Never 7. How often during the last year have you had a feeling of guilt of remorse after drinking?:  Never 8. How often during the last year have you been unable to remember what happened the night before because you had been drinking?: Never 9. Have you or someone else been injured as a result of your drinking?: No 10. Has a relative or friend or a doctor or another health worker been concerned about your drinking or suggested you cut down?: No Alcohol Use Disorder Identification Test Final Score (AUDIT): 4 Substance Abuse History in the last 12 months:  Yes.   -used Marijuana two days ago Consequences of Substance Abuse: NA Previous Psychotropic Medications: Yes  Psychological Evaluations: No  Past Medical History:  Past Medical History:  Diagnosis Date  . Medical history non-contributory     Past Surgical History:  Procedure Laterality Date  . 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  Plans to go back to school.  He admits drinking Fridays and Saturdays and 2-3 times over the weekends.  Occasionally uses Marijuana.  Social History   Substance and Sexual Activity  Alcohol Use Yes   Comment: "occasionally every other day, 1-2 drinks, last drink 3 days ago"     Social History   Substance and Sexual Activity  Drug Use Not Currently    Additional Social History:                           Allergies:  No Known Allergies Lab Results:  Results for orders placed or performed during the hospital encounter of 03/05/20 (from the past 48 hour(s))  Comprehensive metabolic panel     Status: Abnormal   Collection Time: 03/05/20 10:52 PM  Result Value Ref Range   Sodium 140 135 - 145 mmol/L   Potassium 3.8 3.5 - 5.1 mmol/L   Chloride 101 98 - 111 mmol/L   CO2 27 22 - 32 mmol/L   Glucose, Bld 99 70 - 99 mg/dL    Comment: Glucose reference range applies only to samples taken after fasting for at  least 8 hours.   BUN 9 6 - 20 mg/dL   Creatinine, Ser 7.61 0.61 - 1.24 mg/dL   Calcium 9.4 8.9 - 60.7 mg/dL   Total Protein 7.5 6.5 - 8.1 g/dL   Albumin 4.4 3.5 - 5.0 g/dL   AST 23 15 - 41 U/L   ALT 29 0 - 44 U/L   Alkaline Phosphatase 41 38 - 126 U/L   Total Bilirubin 1.7 (H) 0.3 - 1.2 mg/dL   GFR calc non Af Amer >60 >60 mL/min   GFR calc Af Amer >60 >60 mL/min   Anion gap 12 5 - 15    Comment: Performed at [facility], [address] [place], [place] [address]  Ethanol     Status: None   Collection Time: 03/05/20 10:52 PM  Result Value Ref Range   Alcohol, Ethyl (B) <10 <10 mg/dL    Comment: (NOTE) Lowest detectable limit for serum alcohol is 10 mg/dL.  For medical purposes only. Performed at [facility], [address] [place], [place] [address]   Salicylate level     Status: Abnormal   Collection Time: 03/05/20 10:52 PM  Result Value Ref Range   Salicylate Lvl <7.0 (L) 7.0 - 30.0 mg/dL    Comment: Performed at [facility], [address] [place], [place] [address]  Acetaminophen level     Status: Abnormal   Collection Time: 03/05/20 10:52 PM  Result Value Ref Range   Acetaminophen (Tylenol), Serum <10 (L) 10 - 30 ug/mL    Comment: (NOTE) Therapeutic concentrations vary significantly. A range of 10-30 ug/mL  may be an effective concentration for many patients. However, some  are best treated at concentrations outside of this range. Acetaminophen concentrations >150 ug/mL at 4 hours after ingestion  and >50 ug/mL at 12 hours after ingestion are often associated with  toxic reactions.  Performed at [facility], [address], [place], [place] [address]   cbc     Status: None   Collection Time: 03/05/20 10:52 PM  Result Value Ref Range   WBC 8.2 4.0 - 10.5 K/uL   RBC 4.96 4.22 - 5.81 MIL/uL   Hemoglobin 14.9 13.0 - 17.0 g/dL   HCT 27.0 39 - 52 %   MCV 89.3 80.0 - 100.0 fL   MCH 30.0 26.0 - 34.0 pg   MCHC 33.6 30.0 - 36.0 g/dL    RDW 62.3 76.2 - 83.1 %   Platelets 225 150 - 400 K/uL   nRBC 0.0 0.0 - 0.2 %    Comment: Performed at [facility] Lab, [address], [place], [place] [address]  Respiratory Panel by RT PCR (Flu A&B, Covid) - Nasopharyngeal Swab     Status: None   Collection Time: 03/06/20  2:28 AM   Specimen: Nasopharyngeal Swab  Result Value Ref Range   SARS Coronavirus 2 by RT PCR NEGATIVE NEGATIVE    Comment: (NOTE) SARS-CoV-2 target nucleic acids are NOT DETECTED.  The SARS-CoV-2 RNA is generally detectable in upper respiratoy specimens during the acute phase of infection. The lowest concentration of SARS-CoV-2 viral copies this assay can detect is 131 copies/mL. A negative result does not preclude SARS-Cov-2 infection and should not be used as the sole basis for treatment or other patient management decisions. A negative result may occur with  improper specimen collection/handling, submission of specimen other than nasopharyngeal swab, presence of viral mutation(s) within the areas targeted by this assay, and inadequate number of viral copies (<131 copies/mL). A negative result must be combined with clinical observations, patient history, and epidemiological information. The expected result is Negative.  Fact Sheet for Patients:  https://www.moore.com/  Fact Sheet for Healthcare Providers:  https://www.young.biz/  This test is no t yet approved or cleared by the [place] FDA and  has been authorized for detection and/or diagnosis of SARS-CoV-2 by FDA under an Emergency Use Authorization (EUA). This EUA will remain  in effect (meaning this test can be used) for the duration of the COVID-19 declaration under Section 564(b)(1) of the Act, 21 U.S.C. section 360bbb-3(b)(1), unless the authorization is terminated or revoked sooner.     Influenza A by PCR NEGATIVE NEGATIVE   Influenza B by PCR NEGATIVE NEGATIVE    Comment: (NOTE) The Xpert  Xpress  SARS-CoV-2/FLU/RSV assay is intended as an aid in  the diagnosis of influenza from Nasopharyngeal swab specimens and  should not be used as a sole basis for treatment. Nasal washings and  aspirates are unacceptable for Xpert Xpress SARS-CoV-2/FLU/RSV  testing.  Fact Sheet for Patients: https://www.moore.com/  Fact Sheet for Healthcare Providers: https://www.young.biz/  This test is not yet approved or cleared by the [place] FDA and  has been authorized for detection and/or diagnosis of SARS-CoV-2 by  FDA under an Emergency Use Authorization (EUA). This EUA will remain  in effect (meaning this test can be used) for the duration of the  Covid-19 declaration under Section 564(b)(1) of the Act, 21  U.S.C. section 360bbb-3(b)(1), unless the authorization is  terminated or revoked. Performed at [facility], [address] [place], [place] [address]     Blood Alcohol level:  Lab Results  Component Value Date   ETH <10 03/05/2020    Metabolic Disorder Labs:  No results found for: HGBA1C, MPG No results found for: PROLACTIN No results found for: CHOL, TRIG, HDL, CHOLHDL, VLDL, LDLCALC  Current Medications: Current Facility-Administered Medications  Medication Dose Route Frequency Provider Last Rate Last Admin  . acetaminophen (TYLENOL) tablet 650 mg  650 mg Oral Q6H PRN Dixon, Rashaun M, NP      . alum & mag hydroxide-simeth (MAALOX/MYLANTA) 200-200-20 MG/5ML suspension 30 mL  30 mL Oral Q4H PRN Dixon, Rashaun M, NP      . magnesium hydroxide (MILK OF MAGNESIA) suspension 30 mL  30 mL Oral Daily PRN Dixon, Rashaun M, NP      . traZODone (DESYREL) tablet 50 mg  50 mg Oral QHS PRN Jearld Lesch, NP       PTA Medications: Medications Prior to Admission  Medication Sig Dispense Refill Last Dose  . cholecalciferol (VITAMIN D) 25 MCG (1000 UNIT) tablet Take 1,000 Units by mouth daily.     Marland Kitchen ibuprofen (ADVIL) 200 MG tablet  Take 400 mg by mouth every 6 (six) hours as needed for moderate pain.     Marland Kitchen ketoconazole (NIZORAL) 2 % shampoo Apply 1 application topically 3 (three) times a week.     . sertraline (ZOLOFT) 100 MG tablet Take 100 mg by mouth daily.     Marland Kitchen terbinafine (LAMISIL) 250 MG tablet Take 250 mg by mouth daily.       Musculoskeletal: Strength & Muscle Tone: within normal limits Gait & Station: normal Patient leans: N/A  Psychiatric Specialty Exam: Physical Exam Vitals and nursing note reviewed.  Constitutional:      Appearance: Normal appearance.  HENT:     Head: Normocephalic and atraumatic.  Cardiovascular:     Rate and Rhythm: Normal rate and regular rhythm.     Pulses: Normal pulses.  Musculoskeletal:        General: Normal range of motion.     Cervical back: Normal range of motion.  Skin:    General: Skin is warm.  Neurological:     Mental Status: He is alert.     Review of Systems  Constitutional: Negative.   HENT: Negative.   Eyes: Negative.   Respiratory: Negative.   Cardiovascular: Negative.   Gastrointestinal: Negative.   Endocrine: Negative.   Genitourinary: Negative.   Musculoskeletal: Negative.   Allergic/Immunologic: Negative.   Neurological: Negative.   Hematological: Negative.   Psychiatric/Behavioral:       Poor energy, No motivation to do things, insomnia, guilt feelings-relationship ending due to his behavior.  Blood pressure (!) 118/91, pulse 73, temperature 98.1 F (36.7 C), temperature source Oral, resp. rate 18, height 5\' 9"  (1.753 m), weight 91.2 kg, SpO2 100 %.Body mass index is 29.68 kg/m.  General Appearance: Casual and Fairly Groomed  Eye Contact:  Good  Speech:  Clear and Coherent and Normal Rate  Volume:  Normal  Mood:  Depressed  Affect:  Congruent  Thought Process:  Coherent and Goal Directed  Orientation:  Full (Time, Place, and Person)  Thought Content:  Logical  Suicidal Thoughts:  No  Homicidal Thoughts:  No  Memory:  Immediate;    Good Recent;   Good Remote;   Good  Judgement:  Good  Insight:  Good  Psychomotor Activity:  Normal  Concentration:  Concentration: Good  Recall:  Good  Fund of Knowledge:  Good  Language:  Good  Akathisia:  No  Handed:  Right  AIMS (if indicated):     Assets:  Communication Skills Desire for Improvement Physical Health Vocational/Educational  ADL's:  Intact  Cognition:  WNL  Sleep:       Treatment Plan Summary: Daily contact with patient to assess and evaluate symptoms and progress in treatment and Medication management  -Continue Trazodone 50 mg po at bed time as needed for sleep -Start Wellbutrin 150 mg XL, 24 hrs daily for depression Engage in therapy while in the unit.  Observation Level/Precautions:  15 minute checks  Laboratory:  Obtain TSH, UDS in am.  Rest of lab results normal  Psychotherapy:  Encouraged to participate  Medications:  See MAR  Consultations:  NA  Discharge Concerns:  Medication compliance  Estimated LOS:3-5 days  Other:     Physician Treatment Plan for Primary Diagnosis: MDD (major depressive disorder) Long Term Goal(s): Improvement in symptoms so as ready for discharge  Short Term Goals: Ability to identify changes in lifestyle to reduce recurrence of condition will improve, Ability to verbalize feelings will improve, Ability to disclose and discuss suicidal ideas, Ability to demonstrate self-control will improve, Ability to identify and develop effective coping behaviors will improve, Ability to maintain clinical measurements within normal limits will improve, Compliance with prescribed medications will improve and Ability to identify triggers associated with substance abuse/mental health issues will improve  Physician Treatment Plan for Secondary Diagnosis: Principal Problem:   MDD (major depressive disorder)  Long Term Goal(s): Improvement in symptoms so as ready for discharge  Short Term Goals: Ability to identify changes in lifestyle to  reduce recurrence of condition will improve, Ability to verbalize feelings will improve, Ability to disclose and discuss suicidal ideas, Ability to demonstrate self-control will improve, Ability to identify and develop effective coping behaviors will improve, Ability to maintain clinical measurements within normal limits will improve, Compliance with prescribed medications will improve and Ability to identify triggers associated with substance abuse/mental health issues will improve  I certify that inpatient services furnished can reasonably be expected to improve the patient's condition.    Earney NavyJosephine C Salihah Peckham, NP 9/25/202112:29 PM

## 2020-03-07 NOTE — [facility] Group Notes (Signed)
.  Psychoeducational Group Note    Date: 02-29-20 Time: 0900-1000    Goal Setting   Purpose of Group: This group helps to provide patients with the steps of setting a goal that is specific, measurable, attainable, realistic and time specific. A discussion on how we keep ourselves stuck with negative self talk.    Participation Level:  Active  Participation Quality:  Appropriate  Affect:  Appropriate  Cognitive:  Appropriate  Insight:  Improving  Engagement in Group:  Engaged  Additional Comments:  Pt was able to rate his energy as a 6/10. States he is accepting the fact that he is here and is going to invest in himself and attend all the groups.  Dione Housekeeper

## 2020-03-07 NOTE — [facility] Suicide Risk Assessment (Signed)
[facility] Admission Suicide Risk Assessment   Nursing information obtained from:  Patient Demographic factors:  Male, Living alone, Unemployed, Access to firearms, Low socioeconomic status Current Mental Status:  Suicidal ideation indicated by others Loss Factors:  Decrease in vocational status, Loss of significant relationship, Legal issues, Financial problems / change in socioeconomic status  Current Stressors:  Patient states their primary concerns and needs for treatment are:: Struggling with feelings of guilt and selfishness about breaking his family that his newborn son came into, breaking his ex-fiancee's trust and heart.  Had a panic attack during [employer] and was forced to resign. Patient states their goals for this hospitalization and ongoing recovery are:: Accept what he did, the results of what he did, deal with his guilt and know he is not alone. Educational / Learning stressors: Denies stressors - intends to make this a priority Employment / Job issues: Has to figure out his next move, since he had to resign from the [employer] 9 days ago. Family Relationships: Very stressful - Ex-fiancee and relationships he has to build part-time with his children. Financial / Lack of resources (include bankruptcy): Can easily become stressful if does not find income before too long. Housing / Lack of housing: Denies stressors, but does miss his family that he had always anticipated sharing the home with. Physical health (include injuries & life threatening diseases): Getting older, has been having neck & back problems and just found out he has degenerative disk disease. Social relationships: Because of his shame, he is hesitant to ask for help from his friends for what he is going through. Substance abuse: Denies stressors, had not smoked marijuana for years but did smoke the day he had to resign from the [employer].  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  Loss of relationship with ex-fiancee and children.  Historical Factors:  Impulsivity Risk Reduction Factors:  Responsible for children under 40 years of age, Sense of responsibility to family, Positive social support, Positive therapeutic relationship  Total Time spent with patient: 20 minutes Principal Problem: MDD (major depressive disorder) Diagnosis:  Principal Problem:   MDD (major depressive disorder) Active Problems:   Tetrahydrocannabinol (THC) use disorder, moderate, dependence (HCC)  Subjective Data: "I did not think that I was going to be admitted, but I am going to use this time to get better."  History of Present Illness: Male  [place] Veteran admitted for severe depression and suicidal thought to the unit yesterday evening.  He was seen this morning for admission assessment and he participated willingly and fully.  He was feeling suicidal after breaking up with his fiance and states he feels ashamed because what happened between them is his fault.  He called to speak to somebody and after their discussion he admitted suicide intent and the person called police to go do a wellness check.  Patient was  then brought to [facility] health Urgent care by [employer].  Patient was then taken to [facility] ER for further evaluation and then brought to the unit.  This is his first inpatient Psychiatric unit hospitalization.  Veteran admits to previous counseling starting from age 66 when his father murdered somebody.  He is still in the [employer] and was [place] and getting counseling at [facility] moved to [place] recently and is planning to establish care at [facility].  Veteran reports poor sleep, low motivation and poor energy, isolating  and feelings of sadness.  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   He had a gun on the day he broke up with his girl friend. Ex-  fiance has been supportive since he came to the hospital.  Thomasene Ripple does not want medications but want weekly face to face counseling.  He tried Trazodone for sleep in [place] and stopped because it made him Groggy.  He tried Sertraline in the past and did not like the way he felt.  Today he declined medication but want to engage in group activities.  [facility] [place] [facility].  He denies suicide ideation, no previous attempt and no Psychotic symptoms.    We will prescribed Wellbutrin for depression which Patient agrees with the Psychiatrist to try. Associated Signs/Symptoms: Depression Symptoms:  depressed mood, anhedonia, insomnia, fatigue, disturbed sleep, decreased appetite, Duration of Depression Symptoms: No data recorded (Hypo) Manic Symptoms:  na Anxiety Symptoms:  Excessive Worry, Psychotic Symptoms:  na Duration of Psychotic Symptoms: No data recorded PTSD Symptoms: NA Total Time spent with patient: 45 minutes  Past Psychiatric History: Depression, was receiving counseling [facility]  Is the patient at risk to self? No.  Has the patient been a risk to self in the past 6 months? No.  Has the patient been a risk to self within the distant past? No.  Is the patient a risk to others? No.  Has the patient been a risk to others in the past 6 months? No.  Has the patient been a risk to others within the distant past? No.   Prior Inpatient Therapy:  No Prior Outpatient Therapy:   In [place] through  [place]    Continued Clinical Symptoms:  Alcohol Use Disorder Identification Test Final Score (AUDIT): 4 The "Alcohol Use Disorders Identification Test", Guidelines for Use in Primary Care, Second Edition.  World Science writer [facility]). Score between 0-7:  no or low risk or alcohol related problems. Score between 8-15:  moderate risk of alcohol related problems. Score between 16-19:  high risk of alcohol related  problems. Score 20 or above:  warrants further diagnostic evaluation for alcohol dependence and treatment.   CLINICAL FACTORS:   Depression:   Marijuana use/dependence  Musculoskeletal: Strength & Muscle Tone: within normal limits Gait & Station: normal Patient leans: N/A  Psychiatric Specialty Exam: Physical Exam Vitals and nursing note reviewed.  Constitutional:      Appearance: Normal appearance.  HENT:     Head: Normocephalic and atraumatic.  Cardiovascular:     Rate and Rhythm: Normal rate and regular rhythm.     Pulses: Normal pulses.  Musculoskeletal:        General: Normal range of motion.     Cervical back: Normal range of motion.  Skin:    General: Skin is warm.  Neurological:     Mental Status: He is alert.     Review of Systems  Constitutional: Negative.   HENT: Negative.   Eyes: Negative.   Respiratory: Negative.   Cardiovascular: Negative.   Gastrointestinal: Negative.   Endocrine: Negative.   Genitourinary: Negative.   Musculoskeletal: Negative.   Allergic/Immunologic: Negative.   Neurological: Negative.   Hematological: Negative.   Psychiatric/Behavioral:       Poor energy, No motivation to do things, insomnia, guilt feelings-relationship ending due to his behavior.    Blood pressure (!) 118/91, pulse  73, temperature 98.1 F (36.7 C), temperature source Oral, resp. rate 18, height 5\' 9"  (1.753 m), weight 91.2 kg, SpO2 100 %.Body mass index is 29.68 kg/m.  General Appearance: Casual and Fairly Groomed  Eye Contact:  Good  Speech:  Clear and Coherent and Normal Rate  Volume:  Normal  Mood:  Depressed  Affect:  Congruent  Thought Process:  Coherent and Goal Directed  Orientation:  Full (Time, Place, and Person)  Thought Content:  Logical  Suicidal Thoughts:  No  Homicidal Thoughts:  No  Memory:  Immediate;   Good Recent;   Good Remote;   Good  Judgement:  Good  Insight:  Good  Psychomotor Activity:  Normal  Concentration:   Concentration: Good  Recall:  Good  Fund of Knowledge:  Good  Language:  Good  Akathisia:  No  Handed:  Right  AIMS (if indicated):     Assets:  Communication Skills Desire for Improvement Physical Health Vocational/Educational  ADL's:  Intact  Cognition:  WNL  Sleep:   poor    COGNITIVE FEATURES THAT CONTRIBUTE TO RISK:  Thought constriction (tunnel vision)    SUICIDE RISK:   Moderate:  Frequent suicidal ideation with limited intensity, and duration, some specificity in terms of plans, no associated intent, good self-control, limited dysphoria/symptomatology, some risk factors present, and identifiable protective factors, including available and accessible social support.  PLAN OF CARE:  Treatment Plan Summary: Daily contact with patient to assess and evaluate symptoms and progress in treatment and Medication management  -Continue Trazodone 50 mg po at bed time as needed for sleep -Start Wellbutrin 150 mg XL, 24 hrs daily for depression Engage in therapy while in the unit.  Observation Level/Precautions:  15 minute checks  Laboratory:  Obtain TSH, UDS in am.  Rest of lab results normal  Psychotherapy:  Encouraged to participate  Medications:  See MAR  Consultations:  NA  Discharge Concerns:  Medication compliance  Estimated LOS:3-5 days  Other:     Physician Treatment Plan for Primary Diagnosis: MDD (major depressive disorder) Long Term Goal(s): Improvement in symptoms so as ready for discharge  Short Term Goals: Ability to identify changes in lifestyle to reduce recurrence of condition will improve, Ability to verbalize feelings will improve, Ability to disclose and discuss suicidal ideas, Ability to demonstrate self-control will improve, Ability to identify and develop effective coping behaviors will improve, Ability to maintain clinical measurements within normal limits will improve, Compliance with prescribed medications will improve and Ability to identify triggers  associated with substance abuse/mental health issues will improve  Physician Treatment Plan for Secondary Diagnosis: Principal Problem:   MDD (major depressive disorder) Patient Active Problem List   Diagnosis Date Noted  . MDD (major depressive disorder) 03/07/2020  . Tetrahydrocannabinol (THC) use disorder, moderate, dependence (HCC) 03/07/2020  . 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 Community acquired MRSA infection 07/21/2012     Long Term Goal(s): Improvement in symptoms so as ready for discharge  Short Term Goals: Ability to identify changes in lifestyle to reduce recurrence of condition will improve, Ability to verbalize feelings will improve, Ability to disclose and discuss suicidal ideas, Ability to demonstrate self-control will improve, Ability to identify and develop effective coping behaviors will improve, Ability to maintain clinical measurements within normal limits will improve, Compliance with prescribed medications will improve and Ability to identify triggers associated with substance abuse/mental health issues will improve    I certify that inpatient services furnished can reasonably be expected to improve the patient's  condition.   Mariel CraftSHEILA M Alfhild Partch, MD 03/07/2020, 8:18 PM

## 2020-03-07 NOTE — [facility] Group Notes (Signed)
Psychoeducational Group Note    Date: 03-07-2020 Time: 1300-1400    Life Skills:  Purpose of Group: . The group focus' on teaching patients on how to identify their needs and how to develop the coping skills needed to get their needs met  Participation Level:  Active  Participation Quality:  Appropriate  Affect:  Appropriate  Cognitive:  Oriented  Insight:  Improving  Engagement in Group:  Engaged  Additional Comments:  Pt was able to rate his energy level at a 8/10. States he is learning more about himself  Benjamin Bowen

## 2020-03-07 NOTE — B[facility] Group Notes (Signed)
LCSW Group Therapy Note  03/07/2020   10:00-11:00am   Type of Therapy and Topic:  Group Therapy: Anger Cues and Responses  Participation Level:  Active   Description of Group:   In this group, patients learned how to recognize the physical, cognitive, emotional, and behavioral responses they have to anger-provoking situations.  They identified a recent time they became angry and how they reacted.  They analyzed how their reaction was possibly beneficial and how it was possibly unhelpful.  The group discussed a variety of healthier coping skills that could help with such a situation in the future.  Focus was placed on how helpful it is to recognize the underlying emotions to our anger, because working on those can lead to a more permanent solution as well as our ability to focus on the important rather than the urgent.  Therapeutic Goals: Patients will remember their last incident of anger and how they felt emotionally and physically, what their thoughts were at the time, and how they behaved. Patients will identify how their behavior at that time worked for them, as well as how it worked against them. Patients will explore possible new behaviors to use in future anger situations. Patients will learn that anger itself is normal and cannot be eliminated, and that healthier reactions can assist with resolving conflict rather than worsening situations.  Summary of Patient Progress:  The patient shared that his most recent time of anger was last night when he felt he was not being heard when the doctors insisted on him coming to [facility] and said he had been led to believe that he would be discharging home.  He was frustrated and irritated and stated this was in part because his pride got in the way.  He acknowledged that he put himself in the situation and said much of his anger was directed at himself.  He showed excellent insight and a willingness to grow.  Therapeutic Modalities:    Cognitive Behavioral Therapy  Lynnell Chad

## 2020-03-07 NOTE — Tx Team (Signed)
Initial Treatment Plan 03/07/2020 6:13 AM [name] [id number]    PATIENT STRESSORS: Legal issue Marital or family conflict Substance abuse   PATIENT STRENGTHS: Capable of independent living Communication skills Motivation for treatment/growth Supportive family/friends   PATIENT IDENTIFIED PROBLEMS: anxiety  Depression  "coping strategy"  "ability to manage stress"               DISCHARGE CRITERIA:  Ability to meet basic life and health needs Improved stabilization in mood, thinking, and/or behavior Medical problems require only outpatient monitoring Motivation to continue treatment in a less acute level of care Safe-care adequate arrangements made  PRELIMINARY DISCHARGE PLAN: Attend aftercare/continuing care group Attend PHP/IOP Outpatient therapy Return to previous living arrangement  PATIENT/FAMILY INVOLVEMENT: This treatment plan has been presented to and reviewed with the patient, Benjamin Bowen, and/or family memberAna.  The patient and family have been given the opportunity to ask questions and make suggestions.  Bethann Punches, RN 03/07/2020, 6:13 AM

## 2020-03-07 NOTE — Progress Notes (Signed)
Pt denies SI/HI/AVH.  Pt stated that he has never been suicidal and that his main goal was  to find a psychotherapist so pt can start to receive regular counseling sessions. Pt is calm and cooperative during conversation with RN.  Pt is also conversing well with peers on the unit. RN provided pt with specimen cup and will send down to pharmacy when specimen has been collected.   RN will continue to monitor and provide support as needed.

## 2020-03-08 DIAGNOSIS — F1220 Cannabis dependence, uncomplicated: Secondary | ICD-10-CM

## 2020-03-08 LAB — TSH: TSH: 1.466 u[IU]/mL (ref 0.350–4.500)

## 2020-03-08 NOTE — B[facility] Group Notes (Signed)
B[facility] LCSW Group Therapy Note  03/08/2020    Type of Therapy and Topic:  Group Therapy:  Adding Supports Including Yourself  Participation Level:  Active   Description of Group:   Patients in this group were introduced to the concept that additional supports including self-support are an essential part of recovery.  Patients listed what supports they believe they need to add to their lives to achieve their goals at discharge, and they listed such things as therapist, family, doctor, support groups, 12-step groups and service animals.   A song entitled "My Own Hero" was played and a group discussion ensued in which patients stated they could relate to the song and it inspired them to realize they have be willing to help themselves in order to succeed, because other people cannot achieve sobriety or stability for them.  "Fight For It" was played, then "I Am Enough" to encourage patients.  They discussed the impact on them and how they must remain convinced that their lives are worth the effort it takes to become sober and/or stable.  Therapeutic Goals: 1)  demonstrate the importance of being a key part of one's own support system 2)  discuss various available supports 3)  encourage patient to use music as part of their self-support and focus on goals 4)  elicit ideas from patients about supports that need to be added   Summary of Patient Progress:  The patient expressed that his family, faith, and friends are healthy supports, as are his 30 children aged 32yo, 60yo, and 79mo.  He stated that alcohol and he himself are unhealthy supports.  He identified his own shame, guilt, and embarrassment as being barriers to him asking for help.  He enjoyed the music, but more than that, felt the songs had a specific message for him about relying on himself, asking for help, and adding other supports.   Therapeutic Modalities:   Motivational Interviewing Activity  Carloyn Jaeger Grossman-Orr  8:58 AM

## 2020-03-08 NOTE — Plan of Care (Addendum)
D:  Patient's self inventory sheet, patient sleeps good, sleep medication helpful.  Good appetite, normal energy level, good concentration.  Rated depression 2, denied hopeless, anxiety #1.  Denied withdrawals.  Denied SI.  Denied physical problems.  Physical pain, worst pain in the last 24 hours is #4, neck and back, pain medicine is helpful.  Goal is to have positive thoughts, be better man, father and friend.  Plans to remember children who are counting on him.  Outpatient therapy would be great for him.  Does have discharge plans. A:  Medications administered per MD orders.  Emotional support and encouragement given patient. R:  Denied SI and HI, contracts for safety.  Denied A/V hallucinations.  Safety maintained with 15 minute checks. Marland Kitchen

## 2020-03-08 NOTE — Progress Notes (Signed)
   03/08/20 2227  Psych Admission Type (Psych Patients Only)  Admission Status Involuntary  Psychosocial Assessment  Patient Complaints None  Eye Contact Fair  Facial Expression Animated  Affect Appropriate to circumstance  Speech Logical/coherent  Interaction Assertive  Motor Activity Other (Comment) (WDL)  Appearance/Hygiene Unremarkable  Behavior Characteristics Appropriate to situation  Mood Pleasant  Thought Process  Coherency WDL  Content WDL  Delusions None reported or observed  Perception WDL  Hallucination None reported or observed  Judgment WDL  Confusion None  Danger to Self  Current suicidal ideation? Denies  Danger to Others  Danger to Others None reported or observed

## 2020-03-08 NOTE — Progress Notes (Signed)
   03/08/20 2225  COVID-19 Daily Checkoff  Have you had a fever (temp > 37.80C/100F)  in the past 24 hours?  No  If you have had runny nose, nasal congestion, sneezing in the past 24 hours, has it worsened? No  COVID-19 EXPOSURE  Have you traveled outside the state in the past 14 days? No  Have you been in contact with someone with a confirmed diagnosis of COVID-19 or PUI in the past 14 days without wearing appropriate PPE? No  Have you been living in the same home as a person with confirmed diagnosis of COVID-19 or a PUI (household contact)? No  Have you been diagnosed with COVID-19? No

## 2020-03-08 NOTE — Progress Notes (Signed)
[facility] MD Progress Note  03/08/2020 2:31 PM Benjamin Bowen  MRN:  [id number] Subjective:  " Coming in here has helped me to open up and learn.  I am enjoying the group activities" Objective:  Met with patient for daily evaluation.  He appears more calm and relaxed and reports better sleep last night.  He has been participating in group activities and reports learning from other people.  He reports that he has had the time to thing through previous mistakes made and is willing to to make changes.  He has been communicating with his fiance and she has shown support too.  His two guns are out of his house.  His mother has one and his fiance's father took one.  He is taking his Wellbutrin and commits to taking after discharge.  He will reengage with the [facility] in [place] and want to continue individually therapy.  He denies suicide ideation and no suicidal thoughts.  Plan is for discharge tomorrow.  Principal Problem: MDD (major depressive disorder) Diagnosis: Principal Problem:   MDD (major depressive disorder) Active Problems:   Tetrahydrocannabinol (THC) use disorder, moderate, dependence (HCC)  Total Time spent with patient: 20 minutes  Past Psychiatric History: Depression, was receiving counseling at [facility]  Past Medical History:  Past Medical History:  Diagnosis Date   Medical history non-contributory     Past Surgical History:  Procedure Laterality Date   SHOULDER SURGERY     Family History:  Family History  Family history unknown: Yes   Family Psychiatric  History: Denies Social History:  Social History   Substance and Sexual Activity  Alcohol Use Yes   Comment: "occasionally every other day, 1-2 drinks, last drink 3 days ago"     Social History   Substance and Sexual Activity  Drug Use Not Currently    Social History   Socioeconomic History   Marital status: Significant Other    Spouse name: Not on file   Number of children: Not on file    Years of education: Not on file   Highest education level: Not on file  Occupational History   Not on file  Tobacco Use   Smoking status: Never Smoker   Smokeless tobacco: Never Used  Vaping Use   Vaping Use: Every day  Substance and Sexual Activity   Alcohol use: Yes    Comment: "occasionally every other day, 1-2 drinks, last drink 3 days ago"   Drug use: Not Currently   Sexual activity: Not Currently  Other Topics Concern   Not on file  Social History Narrative   Not on file   Social Determinants of Health   Financial Resource Strain:    Difficulty of Paying Living Expenses: Not on file  Food Insecurity:    Worried About Running Out of Food in the Last Year: Not on file   Ran Out of Food in the Last Year: Not on file  Transportation Needs:    Lack of Transportation (Medical): Not on file   Lack of Transportation (Non-Medical): Not on file  Physical Activity:    Days of Exercise per Week: Not on file   Minutes of Exercise per Session: Not on file  Stress:    Feeling of Stress : Not on file  Social Connections:    Frequency of Communication with Friends and Family: Not on file   Frequency of Social Gatherings with Friends and Family: Not on file   Attends Religious Services: Not on file  Active Member of Clubs or Organizations: Not on file   Attends Archivist Meetings: Not on file   Marital Status: Not on file   Additional Social History:                         Sleep: Good  Appetite:  Good  Current Medications: Current Facility-Administered Medications  Medication Dose Route Frequency Provider Last Rate Last Admin   acetaminophen (TYLENOL) tablet 650 mg  650 mg Oral Q6H PRN Deloria Lair, NP   650 mg at 03/08/20 0744   alum & mag hydroxide-simeth (MAALOX/MYLANTA) 200-200-20 MG/5ML suspension 30 mL  30 mL Oral Q4H PRN Deloria Lair, NP       buPROPion (WELLBUTRIN XL) 24 hr tablet 150 mg  150 mg Oral Daily  Gabrien Mentink C, NP   150 mg at 03/08/20 0743   magnesium hydroxide (MILK OF MAGNESIA) suspension 30 mL  30 mL Oral Daily PRN Deloria Lair, NP       traZODone (DESYREL) tablet 50 mg  50 mg Oral QHS PRN Deloria Lair, NP   50 mg at 03/07/20 2115    Lab Results:  Results for orders placed or performed during the hospital encounter of 03/07/20 (from the past 48 hour(s))  Urine rapid drug screen (hosp performed)     Status: Abnormal   Collection Time: 03/07/20 12:03 PM  Result Value Ref Range   Opiates NONE DETECTED NONE DETECTED   Cocaine NONE DETECTED NONE DETECTED   Benzodiazepines NONE DETECTED NONE DETECTED   Amphetamines NONE DETECTED NONE DETECTED   Tetrahydrocannabinol POSITIVE (A) NONE DETECTED   Barbiturates NONE DETECTED NONE DETECTED    Comment: (NOTE) DRUG SCREEN FOR MEDICAL PURPOSES ONLY.  IF CONFIRMATION IS NEEDED FOR ANY PURPOSE, NOTIFY LAB WITHIN 5 DAYS.  LOWEST DETECTABLE LIMITS FOR URINE DRUG SCREEN Drug Class                     Cutoff (ng/mL) Amphetamine and metabolites    1000 Barbiturate and metabolites    200 Benzodiazepine                 229 Tricyclics and metabolites     300 Opiates and metabolites        300 Cocaine and metabolites        300 THC                            50 Performed at [facility], [place] [address], [place], [place] [address]   TSH     Status: None   Collection Time: 03/08/20  7:29 AM  Result Value Ref Range   TSH 1.466 0.350 - 4.500 uIU/mL    Comment: Performed by a 3rd Generation assay with a functional sensitivity of <=0.01 uIU/mL. Performed at [facility], [place] [address], [place], [place] [address]     Blood Alcohol level:  Lab Results  Component Value Date   ETH <10 74/01/1447    Metabolic Disorder Labs: No results found for: HGBA1C, MPG No results found for: PROLACTIN No results found for: CHOL, TRIG, HDL, CHOLHDL, VLDL, LDLCALC  Physical Findings: AIMS: Facial  and Oral Movements Muscles of Facial Expression: None, normal Lips and Perioral Area: None, normal Jaw: None, normal Tongue: None, normal,Extremity Movements Upper (arms, wrists, hands, fingers): None, normal Lower (legs, knees, ankles, toes): None, normal,  Trunk Movements Neck, shoulders, hips: None, normal, Overall Severity Severity of abnormal movements (highest score from questions above): None, normal Incapacitation due to abnormal movements: None, normal Patient's awareness of abnormal movements (rate only patient's report): No Awareness, Dental Status Current problems with teeth and/or dentures?: No Does patient usually wear dentures?: No  CIWA:    COWS:     Musculoskeletal: Strength & Muscle Tone: within normal limits Gait & Station: normal Patient leans: N/A  Psychiatric Specialty Exam: Physical Exam Vitals reviewed.  Constitutional:      Appearance: Normal appearance.  HENT:     Head: Normocephalic and atraumatic.  Cardiovascular:     Rate and Rhythm: Normal rate and regular rhythm.     Pulses: Normal pulses.  Pulmonary:     Effort: Pulmonary effort is normal.  Musculoskeletal:        General: Normal range of motion.     Cervical back: Normal range of motion.  Skin:    General: Skin is warm and dry.  Neurological:     General: No focal deficit present.     Mental Status: He is alert and oriented to person, place, and time.  Psychiatric:        Behavior: Behavior normal.     Review of Systems  Constitutional: Negative.   HENT: Negative.   Eyes: Negative.   Respiratory: Negative.   Cardiovascular: Negative.   Gastrointestinal: Negative.   Endocrine: Negative.   Genitourinary: Negative.   Musculoskeletal: Negative.   Skin: Negative.   Allergic/Immunologic: Negative.   Neurological: Negative.   Hematological: Negative.   Psychiatric/Behavioral: Negative.     Blood pressure 112/77, pulse 81, temperature 98.1 F (36.7 C), temperature source Oral,  resp. rate 16, height 5' 9"  (1.753 m), weight 91.2 kg, SpO2 100 %.Body mass index is 29.68 kg/m.  General Appearance: Casual and Fairly Groomed  Eye Contact:  Good  Speech:  Clear and Coherent and Normal Rate  Volume:  Normal  Mood:  Depressed  Affect:  Congruent  Thought Process:  Coherent and Goal Directed  Orientation:  Full (Time, Place, and Person)  Thought Content:  Logical  Suicidal Thoughts:  No  Homicidal Thoughts:  No  Memory:  Immediate;   Good Recent;   Good Remote;   Good  Judgement:  Good  Insight:  Good  Psychomotor Activity:  Normal  Concentration:  Concentration: Good and Attention Span: Good  Recall:  Good  Fund of Knowledge:  Good  Language:  Good  Akathisia:  No  Handed:  Right  AIMS (if indicated):     Assets:  Communication Skills Desire for Improvement Housing Physical Health Resilience  ADL's:  Intact  Cognition:  WNL  Sleep:  Number of Hours: 5.75   Treatment Plan Summary: Daily contact with patient to assess and evaluate symptoms and progress in treatment and Medication management -Continue Trazodone 50 mg po at bed time as needed for sleep -Continue  Wellbutrin 150 mg XL, 24 hrs daily for depression -Engage in therapy while in the unit  Delfin Gant, NP  PMHNP-BC 03/08/2020, 2:31 PM

## 2020-03-08 NOTE — Plan of Care (Signed)
Nurse discussed anxiety, depression and coping skills with patient.  

## 2020-03-09 MED ORDER — BUPROPION HCL ER (XL) 150 MG PO TB24
150.0000 mg | ORAL_TABLET | Freq: Every day | ORAL | 0 refills | Status: AC
Start: 2020-03-10 — End: ?

## 2020-03-09 NOTE — Progress Notes (Signed)
  [facility] Adult Case Management Discharge Plan :  Will you be returning to the same living situation after discharge:  Yes,  Own home At discharge, do you have transportation home?: Yes,  Girlfriend Do you have the ability to pay for your medications: Yes,  [facility]  Release of information consent forms completed and in the chart;  Patient's signature needed at discharge.  Patient to Follow up at:  Follow-up Information    [facility] on 03/12/2020.   Why: Inital consultation with Dr. Margo Aye on 03/12/2020 at 3:00pm for medication management. Physician appointment on 03/13/2020 at 1:00pm with Devoria Glassing. Please fax discharge paperwork to [phone], RE: Devoria Glassing.     Contact information: [address] [facility] [place] [address] [phone]               Next level of care provider has access to [facility] Link:no  Safety Planning and Suicide Prevention discussed: Yes,  Mother     Has patient been referred to the Quitline?: Patient refused referral  Patient has been referred for addiction treatment: N/A  Aram Beecham, LCSWA 03/09/2020, 11:10 AM

## 2020-03-09 NOTE — [facility] Suicide Risk Assessment (Signed)
[facility] Discharge Suicide Risk Assessment   Principal Problem: MDD (major depressive disorder) Discharge Diagnoses: Principal Problem:   MDD (major depressive disorder) Active Problems:   Tetrahydrocannabinol (THC) use disorder, moderate, dependence (HCC)   Total Time spent with patient: 20 minutes  Musculoskeletal: Strength & Muscle Tone: within normal limits Gait & Station: normal Patient leans: N/A  Psychiatric Specialty Exam: Review of Systems  All other systems reviewed and are negative.   Blood pressure 106/72, pulse 99, temperature 97.7 F (36.5 C), temperature source Oral, resp. rate 16, height 5\' 9"  (1.753 m), weight 91.2 kg, SpO2 100 %.Body mass index is 29.68 kg/m.  General Appearance: Casual  Eye Contact::  Good  Speech:  Normal Rate409  Volume:  Normal  Mood:  Anxious  Affect:  Congruent  Thought Process:  Coherent and Descriptions of Associations: Intact  Orientation:  Full (Time, Place, and Person)  Thought Content:  Logical  Suicidal Thoughts:  No  Homicidal Thoughts:  No  Memory:  Immediate;   Good Recent;   Good Remote;   Good  Judgement:  Intact  Insight:  Fair  Psychomotor Activity:  Normal  Concentration:  Good  Recall:  Good  Fund of Knowledge:Good  Language: Good  Akathisia:  Negative  Handed:  Right  AIMS (if indicated):     Assets:  Desire for Improvement Housing Resilience Social Support  Sleep:  Number of Hours: 6.75  Cognition: WNL  ADL's:  Intact   Mental Status Per Nursing Assessment::   On Admission:  Suicidal ideation indicated by others  Demographic Factors:  Male and Divorced or widowed  Loss Factors: Legal issues  Historical Factors: Impulsivity  Risk Reduction Factors:   Living with another person, especially a relative, Positive social support and Positive therapeutic relationship  Continued Clinical Symptoms:  Depression:   Impulsivity  Cognitive Features That Contribute To Risk:  None    Suicide Risk:   Minimal: No identifiable suicidal ideation.  Patients presenting with no risk factors but with morbid ruminations; may be classified as minimal risk based on the severity of the depressive symptoms   Follow-up Information    [facility] up.   Why: Therapy appointments needed with [phone] - patient is hopeful these can be weekly. Will also need psychiatry appointment or at least appointment with medical provider to remain on medicine.   Contact information: [address] [facility] [place] [phone]               Plan Of Care/Follow-up recommendations:  Activity:  ad lib  [phone], MD 03/09/2020, 9:57 AM

## 2020-03-09 NOTE — B[facility] Suicide Risk Assessment (Signed)
[facility] INPATIENT:  Family/Significant Other Suicide Prevention Education  Suicide Prevention Education:  Education Completed; Marva Panda [phone] (Mother) has been identified by the patient as the family member/significant other with whom the patient will be residing, and identified as the person(s) who will aid the patient in the event of a mental health crisis (suicidal ideations/suicide attempt).  With written consent from the patient, the family member/significant other has been provided the following suicide prevention education, prior to the and/or following the discharge of the patient.  The suicide prevention education provided includes the following:  Suicide risk factors  Suicide prevention and interventions  National Suicide Hotline telephone number  [facility] assessment telephone number  [facility] Emergency Assistance 911  [facility] and/or Residential Mobile Crisis Unit telephone number  Request made of family/significant other to:  Remove weapons (e.g., guns, rifles, knives), all items previously/currently identified as safety concern.    Remove drugs/medications (over-the-counter, prescriptions, illicit drugs), all items previously/currently identified as a safety concern.  The family member/significant other verbalizes understanding of the suicide prevention education information provided.  The family member/significant other agrees to remove the items of safety concern listed above.  Mrs. Garnette Czech states that she can tell that her son is in a better place then he was when he first came to the hospital. Mrs. Garnette Czech states that she has one of the guns and the ex's father has the other.  Mrs. Garnette Czech states that she has the gun locked in a safe at her home.  Mrs. Garnette Czech does not believe Taquan will attempt to hurt himself again and states that she knows what to look for with symptoms of depressions. Mrs. Garnette Czech lives in [place], [place]    Aram Beecham 03/09/2020, 10:32 AM

## 2020-03-09 NOTE — Tx Team (Signed)
Interdisciplinary Treatment and Diagnostic Plan Update  03/09/2020 Time of Session: 9:25am Benjamin Bowen MRN: [id number]  Principal Diagnosis: MDD (major depressive disorder)  Secondary Diagnoses: Principal Problem:   MDD (major depressive disorder) Active Problems:   Tetrahydrocannabinol (THC) use disorder, moderate, dependence (HCC)   Current Medications:  Current Facility-Administered Medications  Medication Dose Route Frequency Provider Last Rate Last Admin  . acetaminophen (TYLENOL) tablet 650 mg  650 mg Oral Q6H PRN Jearld Lesch, NP   650 mg at 03/09/20 0745  . alum & mag hydroxide-simeth (MAALOX/MYLANTA) 200-200-20 MG/5ML suspension 30 mL  30 mL Oral Q4H PRN Dixon, Rashaun M, NP      . buPROPion (WELLBUTRIN XL) 24 hr tablet 150 mg  150 mg Oral Daily Onuoha, Josephine C, NP   150 mg at 03/09/20 0744  . magnesium hydroxide (MILK OF MAGNESIA) suspension 30 mL  30 mL Oral Daily PRN Dixon, Rashaun M, NP      . traZODone (DESYREL) tablet 50 mg  50 mg Oral QHS PRN Jearld Lesch, NP   50 mg at 03/08/20 2123   PTA Medications: Medications Prior to Admission  Medication Sig Dispense Refill Last Dose  . cholecalciferol (VITAMIN D) 25 MCG (1000 UNIT) tablet Take 1,000 Units by mouth daily.     Marland Kitchen ibuprofen (ADVIL) 200 MG tablet Take 400 mg by mouth every 6 (six) hours as needed for moderate pain.     Marland Kitchen ketoconazole (NIZORAL) 2 % shampoo Apply 1 application topically 3 (three) times a week.     . sertraline (ZOLOFT) 100 MG tablet Take 100 mg by mouth daily.     Marland Kitchen terbinafine (LAMISIL) 250 MG tablet Take 250 mg by mouth daily.       Patient Stressors: Legal issue Marital or family conflict Substance abuse  Patient Strengths: Capable of independent living Barrister's clerk for treatment/growth Supportive family/friends  Treatment Modalities: Medication Management, Group therapy, Case management,  1 to 1 session with clinician, Psychoeducation, Recreational  therapy.   Physician Treatment Plan for Primary Diagnosis: MDD (major depressive disorder) Long Term Goal(s): Improvement in symptoms so as ready for discharge Improvement in symptoms so as ready for discharge   Short Term Goals: Ability to identify changes in lifestyle to reduce recurrence of condition will improve Ability to verbalize feelings will improve Ability to disclose and discuss suicidal ideas Ability to demonstrate self-control will improve Ability to identify and develop effective coping behaviors will improve Ability to maintain clinical measurements within normal limits will improve Compliance with prescribed medications will improve Ability to identify triggers associated with substance abuse/mental health issues will improve Ability to identify changes in lifestyle to reduce recurrence of condition will improve Ability to verbalize feelings will improve Ability to disclose and discuss suicidal ideas Ability to demonstrate self-control will improve Ability to identify and develop effective coping behaviors will improve Ability to maintain clinical measurements within normal limits will improve Compliance with prescribed medications will improve Ability to identify triggers associated with substance abuse/mental health issues will improve  Medication Management: Evaluate patient's response, side effects, and tolerance of medication regimen.  Therapeutic Interventions: 1 to 1 sessions, Unit Group sessions and Medication administration.  Evaluation of Outcomes: Adequate for Discharge  Physician Treatment Plan for Secondary Diagnosis: Principal Problem:   MDD (major depressive disorder) Active Problems:   Tetrahydrocannabinol (THC) use disorder, moderate, dependence (HCC)  Long Term Goal(s): Improvement in symptoms so as ready for discharge Improvement in symptoms so as ready for discharge  Short Term Goals: Ability to identify changes in lifestyle to reduce  recurrence of condition will improve Ability to verbalize feelings will improve Ability to disclose and discuss suicidal ideas Ability to demonstrate self-control will improve Ability to identify and develop effective coping behaviors will improve Ability to maintain clinical measurements within normal limits will improve Compliance with prescribed medications will improve Ability to identify triggers associated with substance abuse/mental health issues will improve Ability to identify changes in lifestyle to reduce recurrence of condition will improve Ability to verbalize feelings will improve Ability to disclose and discuss suicidal ideas Ability to demonstrate self-control will improve Ability to identify and develop effective coping behaviors will improve Ability to maintain clinical measurements within normal limits will improve Compliance with prescribed medications will improve Ability to identify triggers associated with substance abuse/mental health issues will improve     Medication Management: Evaluate patient's response, side effects, and tolerance of medication regimen.  Therapeutic Interventions: 1 to 1 sessions, Unit Group sessions and Medication administration.  Evaluation of Outcomes: Adequate for Discharge   RN Treatment Plan for Primary Diagnosis: MDD (major depressive disorder) Long Term Goal(s): Knowledge of disease and therapeutic regimen to maintain health will improve  Short Term Goals: Ability to remain free from injury will improve, Ability to demonstrate self-control, Ability to participate in decision making will improve, Ability to disclose and discuss suicidal ideas, Ability to identify and develop effective coping behaviors will improve and Compliance with prescribed medications will improve  Medication Management: RN will administer medications as ordered by provider, will assess and evaluate patient's response and provide education to patient for  prescribed medication. RN will report any adverse and/or side effects to prescribing provider.  Therapeutic Interventions: 1 on 1 counseling sessions, Psychoeducation, Medication administration, Evaluate responses to treatment, Monitor vital signs and CBGs as ordered, Perform/monitor CIWA, COWS, AIMS and Fall Risk screenings as ordered, Perform wound care treatments as ordered.  Evaluation of Outcomes: Adequate for Discharge   LCSW Treatment Plan for Primary Diagnosis: MDD (major depressive disorder) Long Term Goal(s): Safe transition to appropriate next level of care at discharge, Engage patient in therapeutic group addressing interpersonal concerns.  Short Term Goals: Engage patient in aftercare planning with referrals and resources, Increase social support, Facilitate acceptance of mental health diagnosis and concerns, Facilitate patient progression through stages of change regarding substance use diagnoses and concerns, Identify triggers associated with mental health/substance abuse issues and Increase skills for wellness and recovery  Therapeutic Interventions: Assess for all discharge needs, 1 to 1 time with Social worker, Explore available resources and support systems, Assess for adequacy in community support network, Educate family and significant other(s) on suicide prevention, Complete Psychosocial Assessment, Interpersonal group therapy.  Evaluation of Outcomes: Adequate for Discharge   Progress in Treatment: Attending groups: Yes. Participating in groups: Yes. Taking medication as prescribed: Yes. Toleration medication: Yes. Family/Significant other contact made: Yes, individual(s) contacted:  Mother Marva Panda Patient understands diagnosis: Yes. Discussing patient identified problems/goals with staff: Yes. Medical problems stabilized or resolved: Yes. Denies suicidal/homicidal ideation: Yes. Issues/concerns per patient self-inventory: No.   New problem(s) identified:  No, Describe:  None  New Short Term/Long Term Goal(s): medication stabilization, elimination of SI thoughts, development of comprehensive mental wellness plan.   Patient Goals:  "I just needed someone to talk to and more frequent appointments for counseling at the [facility]"  Discharge Plan or Barriers: Pt will be returning to his home and will follow up with the [facility].  Reason for Continuation  of Hospitalization: Medication stabilization  Estimated Length of Stay: Pt is adequate for discharge   Attendees: Patient: Benjamin Bowen 03/09/2020   Physician:  03/09/2020   Nursing:  03/09/2020   RN Care Manager: Marciano Sequin  03/09/2020   Social Worker: Melba Coon, LCSW 03/09/2020   Recreational Therapist:  03/09/2020   Other:  03/09/2020   Other:  03/09/2020   Other: 03/09/2020      Scribe for Treatment Team: Aram Beecham, LCSWA 03/09/2020 9:57 AM

## 2020-03-09 NOTE — Discharge Summary (Signed)
Physician Discharge Summary Note  Patient:  Benjamin BrucknerRodney Earl Panjwani is an 34 y.o., male MRN:  [id number] DOB:  01/27/1986 Patient phone:  [phone] (home)  Patient address:   [address] [place] [id number],  Total Time spent with patient: 15 minutes  Date of Admission:  03/07/2020 Date of Discharge: 03/09/20  Reason for Admission:  suicidal ideation  Principal Problem: MDD (major depressive disorder) Discharge Diagnoses: Principal Problem:   MDD (major depressive disorder) Active Problems:   Tetrahydrocannabinol (THC) use disorder, moderate, dependence (HCC)   Past Psychiatric History: Depression, was [facility]  Past Medical History:  Past Medical History:  Diagnosis Date  . Medical history non-contributory     Past Surgical History:  Procedure Laterality Date  . SHOULDER SURGERY     Family History:  Family History  Family history unknown: Yes   Family Psychiatric  History: Denies Social History:  Social History   Substance and Sexual Activity  Alcohol Use Yes   Comment: "occasionally every other day, 1-2 drinks, last drink 3 days ago"     Social History   Substance and Sexual Activity  Drug Use Not Currently    Social History   Socioeconomic History  . Marital status: Significant Other    Spouse name: Not on file  . Number of children: Not on file  . Years of education: Not on file  . Highest education level: Not on file  Occupational History  . Not on file  Tobacco Use  . Smoking status: Never Smoker  . Smokeless tobacco: Never Used  Vaping Use  . Vaping Use: Every day  Substance and Sexual Activity  . Alcohol use: Yes    Comment: "occasionally every other day, 1-2 drinks, last drink 3 days ago"  . Drug use: Not Currently  . Sexual activity: Not Currently  Other Topics Concern  . Not on file  Social History Narrative  . Not on file   Social Determinants of Health   Financial Resource Strain:   .  Difficulty of Paying Living Expenses: Not on file  Food Insecurity:   . Worried About Programme researcher, broadcasting/film/videounning Out of Food in the Last Year: Not on file  . Ran Out of Food in the Last Year: Not on file  Transportation Needs:   . Lack of Transportation (Medical): Not on file  . Lack of Transportation (Non-Medical): Not on file  Physical Activity:   . Days of Exercise per Week: Not on file  . Minutes of Exercise per Session: Not on file  Stress:   . Feeling of Stress : Not on file  Social Connections:   . Frequency of Communication with Friends and Family: Not on file  . Frequency of Social Gatherings with Friends and Family: Not on file  . Attends Religious Services: Not on file  . Active Member of Clubs or Organizations: Not on file  . Attends BankerClub or Organization Meetings: Not on file  . Marital Status: Not on file    Hospital Course:  From admission H&P 03/07/20:  Male  [place] admitted for severe depression and suicidal thought to the unit yesterday evening.  He was seen this morning for admission assessment and he participated willingly and fully.  He was feeling suicidal after breaking up with his fiance and states he feels ashamed because what happened between them is his fault.  He called to speak to somebody and after their discussion he admitted suicide intent and the person called police to  go do a wellness check.  Patient was  then brought to [facility] health Urgent care by [employer].  Patient was then taken to [facility] ER for further evaluation and then brought to the unit.  This is his first inpatient Psychiatric unit hospitalization.  Veteran admits to previous counseling starting from age 76 when his father murdered somebody.  He is still in the [employer] and was [place] and getting counseling at [facility] moved to [place] recently and is planning to establish care at [facility].  Veteran reports poor sleep, low motivation and poor energy, isolating and feelings of  sadness.  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   He had a gun on the day he broke up with his girl friend. Ex- fiance has been supportive since he came to the hospital.  Thomasene Ripple does not want medications but want weekly face to face counseling.  He tried Trazodone for sleep in [place] and stopped because it made him Groggy.  He tried Sertraline in the past and did not like the way he felt.  Today he declined medication but want to engage in group activities.  [facility] [place] [facility].  He denies suicide ideation, no previous attempt and no Psychotic symptoms.    We will prescribed Wellbutrin for depression which Patient agrees with the Psychiatrist to try.  Mr. 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 He was placed under IVC due to reports he had held a gun to his head and still had access to guns at home. He was transferred to [facility] on 03/07/20 for inpatient treatment. He remained on the [facility] unit for two days. He was started on Wellbutrin. He participated in group therapy on the unit. He responded well to treatment with no adverse effects reported. He has shown significantly improved mood, affect, sleep, and interaction. He strongly denies any SI/HI/AVH and contracts for safety. He states desire to be there for his three children. He is eager to discharge due to wanting to prepare for upcoming custody case for his daughter. With patient's expressed consent, I spoke with his mother Marva Panda [phone]) this morning who confirmed both guns had been removed from the gun and denied safety concerns with the patient discharging today. He is discharging on the medications listed below. He agrees to follow up at the [facility] (see below).  Patient is provided with prescriptions for medications upon discharge. He is discharging home.  Physical Findings: AIMS: Facial and Oral Movements Muscles of Facial Expression: None, normal Lips and Perioral Area: None, normal Jaw: None, normal Tongue: None, normal,Extremity Movements Upper (arms, wrists, hands, fingers): None, normal Lower (legs, knees, ankles, toes): None, normal, Trunk Movements Neck, shoulders, hips: None, normal, Overall Severity Severity of abnormal movements (highest score from questions above): None, normal Incapacitation due to abnormal movements: None, normal Patient's awareness of abnormal movements (rate only patient's report): No Awareness, Dental Status Current problems with teeth and/or dentures?: No Does patient usually wear dentures?: No  CIWA:    COWS:     Musculoskeletal: Strength & Muscle Tone: within normal limits Gait & Station: normal Patient leans: N/A  Psychiatric Specialty Exam: Physical Exam Vitals and nursing note reviewed.  Constitutional:      Appearance:  He is well-developed.  Cardiovascular:     Rate and Rhythm: Normal rate.  Pulmonary:     Effort: Pulmonary effort is normal.  Neurological:     Mental Status: He is alert and oriented to person, place, and time.     Review of Systems  Constitutional: Negative.   Respiratory: Negative for cough and shortness of breath.   Psychiatric/Behavioral: Negative for agitation, behavioral problems, confusion, decreased concentration, dysphoric mood, hallucinations, self-injury, sleep disturbance and suicidal ideas. The patient is not nervous/anxious and is not hyperactive.     Blood pressure 106/72, pulse 99, temperature 97.7 F (36.5 C), temperature source Oral, resp. rate 16, height 5\' 9"  (1.753 m), weight 91.2 kg, SpO2 100 %.Body mass index is 29.68 kg/m.  See MD's discharge SRA      Has this patient used any form of tobacco in the last 30 days? (Cigarettes, Smokeless Tobacco,  Cigars, and/or Pipes)  No  Blood Alcohol level:  Lab Results  Component Value Date   ETH <10 03/05/2020    Metabolic Disorder Labs:  No results found for: HGBA1C, MPG No results found for: PROLACTIN No results found for: CHOL, TRIG, HDL, CHOLHDL, VLDL, LDLCALC  See Psychiatric Specialty Exam and Suicide Risk Assessment completed by Attending Physician prior to discharge.  Discharge destination:  Home  Is patient on multiple antipsychotic therapies at discharge:  No   Has Patient had three or more failed trials of antipsychotic monotherapy by history:  No  Recommended Plan for Multiple Antipsychotic Therapies: NA  Discharge Instructions    Discharge instructions   Complete by: As directed    Patient is instructed to take all prescribed medications as recommended. Report any side effects or adverse reactions to your outpatient psychiatrist. Patient is instructed to abstain from alcohol and illegal drugs while on prescription medications. In the event of worsening symptoms, patient is instructed to call the crisis hotline, 911, or go to the nearest emergency department for evaluation and treatment.     Allergies as of 03/09/2020   No Known Allergies     Medication List    STOP taking these medications   cholecalciferol 25 MCG (1000 UNIT) tablet Commonly known as: VITAMIN D   ibuprofen 200 MG tablet Commonly known as: ADVIL   ketoconazole 2 % shampoo Commonly known as: NIZORAL   sertraline 100 MG tablet Commonly known as: ZOLOFT   terbinafine 250 MG tablet Commonly known as: LAMISIL     TAKE these medications     Indication  buPROPion 150 MG 24 hr tablet Commonly known as: WELLBUTRIN XL Take 1 tablet (150 mg total) by mouth daily. Start taking on: March 10, 2020  Indication: Major Depressive Disorder       Follow-up Information    Clinic, March 12, 2020 Follow up on 03/12/2020.   Why: Inital consultation with Dr. 03/14/2020 on 03/12/2020 at 3:00pm for  medication management. Physician appointment on 03/13/2020 at 1:00pm with 05/13/2020. Please fax discharge paperwork to [phone], RE: [phone].     Contact information: [address] [facility] [place] [phone]               Follow-up recommendations: Activity as tolerated. Diet as recommended by primary care physician. Keep all scheduled follow-up appointments as recommended.   Comments:   Patient is instructed to take all prescribed medications as recommended. Report any side effects or adverse reactions to your outpatient psychiatrist. Patient is instructed to abstain from alcohol and illegal drugs while on  prescription medications. In the event of worsening symptoms, patient is instructed to call the crisis hotline, 911, or go to the nearest emergency department for evaluation and treatment.  Signed: Aldean Baker, NP 03/09/2020, 3:01 PM

## 2020-03-09 NOTE — Progress Notes (Signed)
Recreation Therapy Notes  Date: 9.27.21 Time: 0930 Location: 300 Hall Dayroom  Group Topic: Stress Management  Goal Area(s) Addresses:  Patient will identify positive stress management techniques. Patient will identify benefits of using stress management post d/c.  Behavioral Response: Engaged  Intervention: Stress Management  Activity:  Body Scan.  LRT played a meditation that focused on taking inventory into how your body is feeling.  The meditation focused on each area of the body individually.  Patients were to listen and follow along as meditation played to engage in activity.    Education:  Stress Management, Discharge Planning.   Education Outcome: Acknowledges Education  Clinical Observations/Feedback: Pt attended and participated in activity.    Caroll Rancher, LRT/CTRS         Caroll Rancher A 03/09/2020 11:46 AM

## 2020-03-09 NOTE — Plan of Care (Signed)
Nurse discussed anxiety, depression and coping skills with patient.  

## 2020-03-09 NOTE — Progress Notes (Addendum)
D:  Patient denied SI and HI, contracts for safety.  Denied A/V hallucinations.  Denied pain. A:  Medications administered per MD orders.  Emotional support and encouragement given patient. R:  Safety maintained with 15 minute checks.  

## 2020-03-09 NOTE — Progress Notes (Signed)
Adult Psychoeducational Group Note  Date:  03/09/2020 Time:  7:28 AM  Group Topic/Focus:  Wrap-Up Group:   The focus of this group is to help patients review their daily goal of treatment and discuss progress on daily workbooks.  Participation Level:  Active  Participation Quality:  Appropriate  Affect:  Appropriate  Cognitive:  Appropriate  Insight: Appropriate  Engagement in Group:  Engaged  Modes of Intervention:  Discussion  Additional Comments:  Pt attend wrap up group. His day was a 10. The one positive thing that happens he goes home tomorrow.  Charna Busman Long 03/09/2020, 7:28 AM

## 2020-03-09 NOTE — Progress Notes (Signed)
Discharge Note:  Patient discharged home with family member.  Patient denied SI and HI, contracts for safety.  Denied A/V hallucinations.  Suicide prevention information given and discussed with patient who stated he understood and had no questions.    Patient stated he received all his belongings, clothing, toiletries, misc items, etc.  Patient stated he appreciated all assistance received from [facility] staff.  All required discharge information given to patient at discharge.

## 2020-03-14 ENCOUNTER — Other Ambulatory Visit: Payer: Self-pay

## 2020-03-14 ENCOUNTER — Emergency Department (HOSPITAL_COMMUNITY)
Admission: EM | Admit: 2020-03-14 | Discharge: 2020-03-14 | Disposition: A | Discharge status: Home / Self Care | Payer: No Typology Code available for payment source | Attending: Emergency Medicine | Admitting: Emergency Medicine

## 2020-03-14 ENCOUNTER — Emergency Department (HOSPITAL_COMMUNITY): Payer: No Typology Code available for payment source

## 2020-03-14 ENCOUNTER — Encounter (HOSPITAL_COMMUNITY): Payer: Self-pay | Admitting: Emergency Medicine

## 2020-03-14 DIAGNOSIS — Z5321 Procedure and treatment not carried out due to patient leaving prior to being seen by health care provider: Secondary | ICD-10-CM | POA: Diagnosis not present

## 2020-03-14 DIAGNOSIS — S6991XA Unspecified injury of right wrist, hand and finger(s), initial encounter: Secondary | ICD-10-CM | POA: Diagnosis not present

## 2020-03-14 DIAGNOSIS — W19XXXA Unspecified fall, initial encounter: Secondary | ICD-10-CM | POA: Insufficient documentation

## 2020-03-14 NOTE — ED Notes (Signed)
Pt called multiple times for a room, no answer 

## 2020-03-14 NOTE — ED Triage Notes (Signed)
Pt. Stated, I fell last night and caught myself with my rt. Hand. My wrist and hand are hurting

## 2020-03-16 NOTE — ED Provider Notes (Signed)
Patient presents with his son to the pediatric ED 2 days later.  Patient states he had x-rays taken but could not wait to be seen by provider.  Patient states he has a wrist fracture.  I have reviewed the notes and patient does have a nondisplaced wrist fracture.  Orthotec to place in volar splint.  Will have patient follow-up with hand specialist.   Niel Hummer, MD 03/16/20 [phone]

## 2021-09-10 IMAGING — US US SCROTUM
1 series · 13 of 25 positions shown · non-contrast
Comparison: None.

CLINICAL DATA: Right testicular trauma, bit during intercourse.

EXAM:
SCROTAL ULTRASOUND
DOPPLER ULTRASOUND OF THE TESTICLES
TECHNIQUE: Complete ultrasound examination of the testicles, epididymis, and
other scrotal structures was performed. Color and spectral Doppler
ultrasound were also utilized to evaluate blood flow to the
testicles.

[Series 1: us scrotum · 13 of 153 slices shown]
[im 1/153]
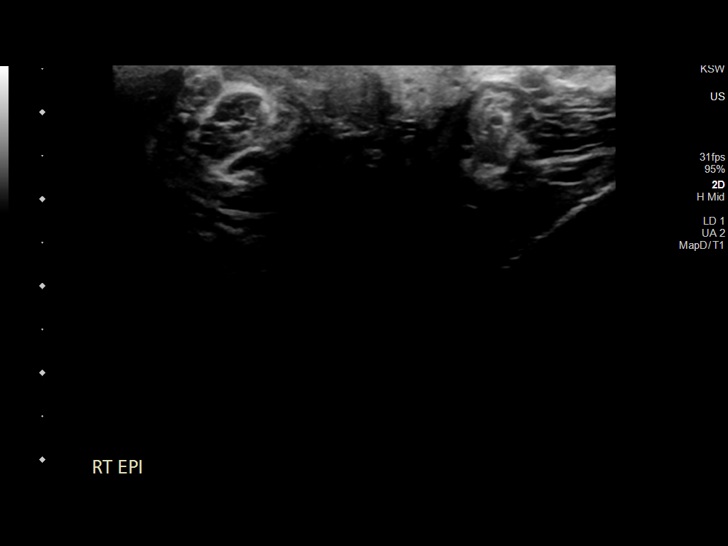
[im 13/153]
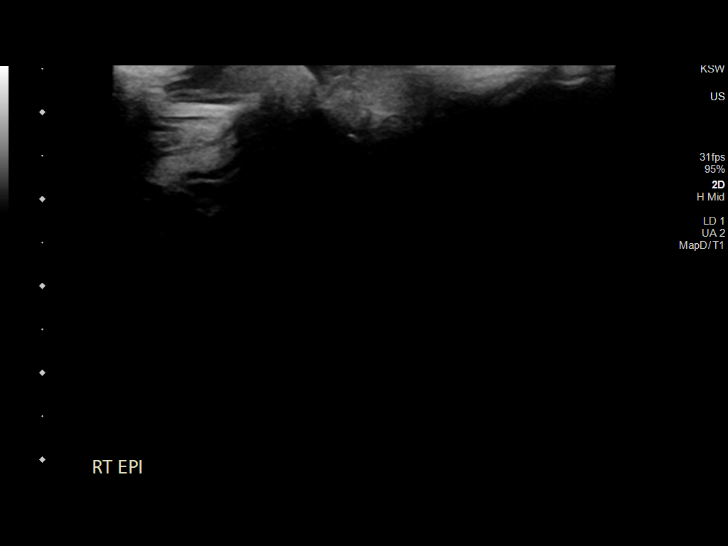
[im 26/153]
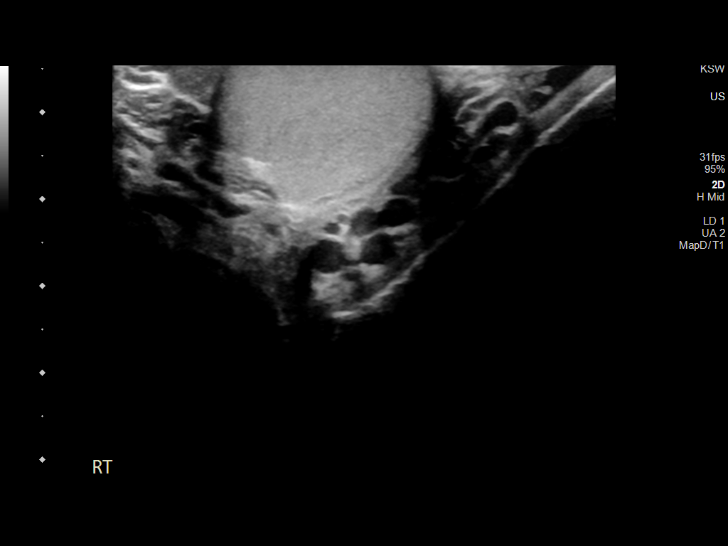
[im 39/153]
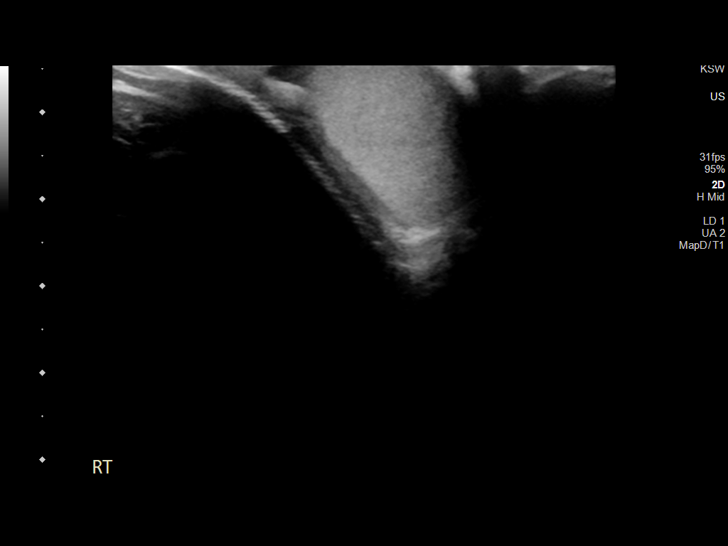
[im 51/153]
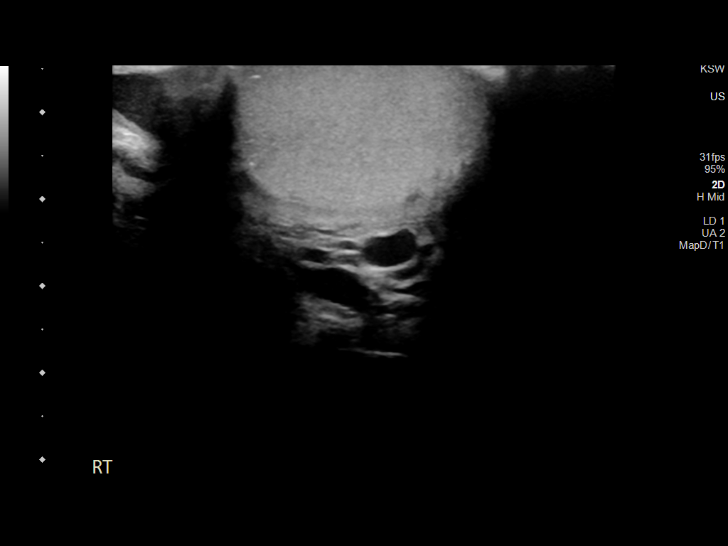
[im 64/153]
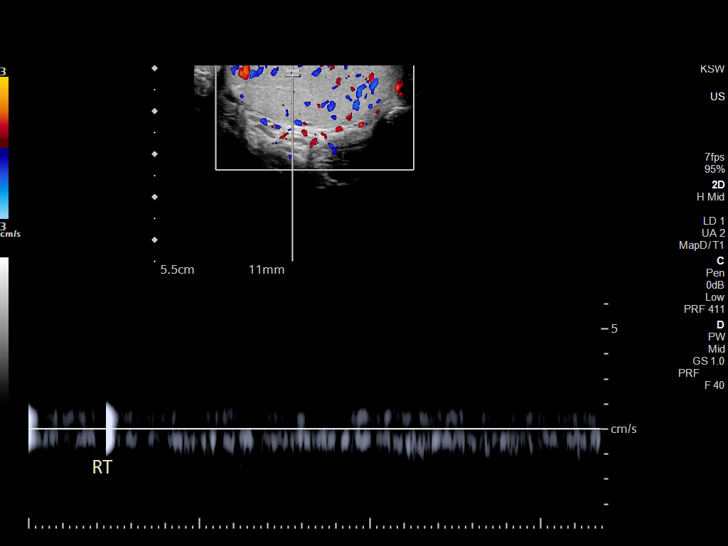
[im 77/153]
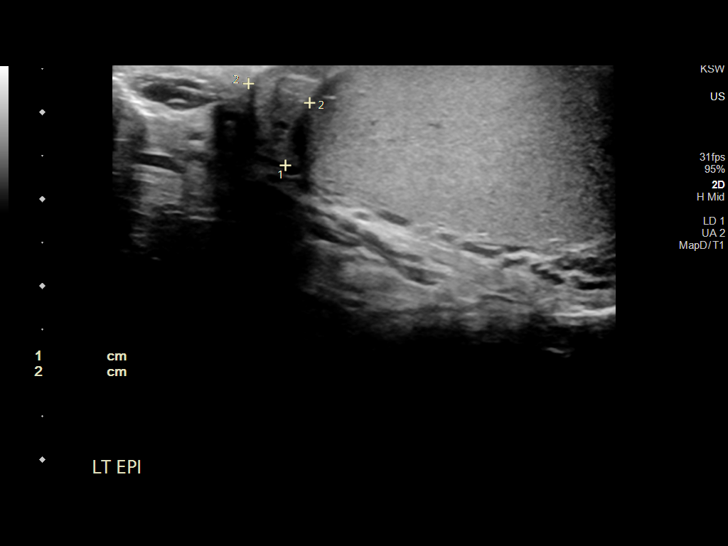
[im 89/153]
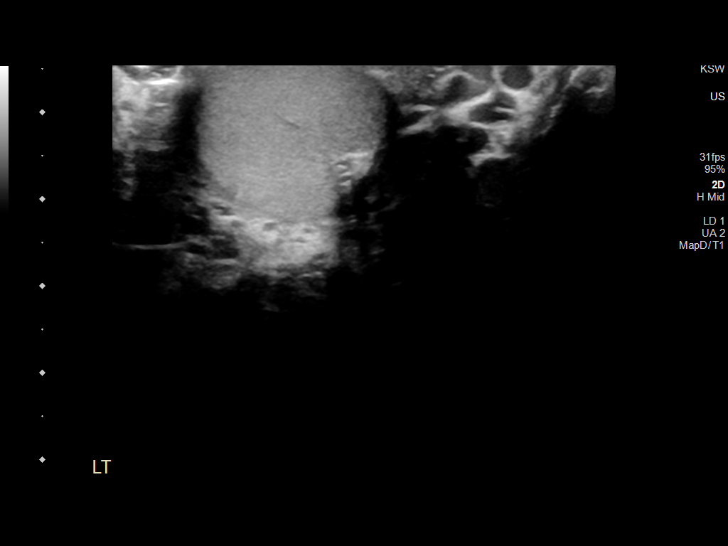
[im 102/153]
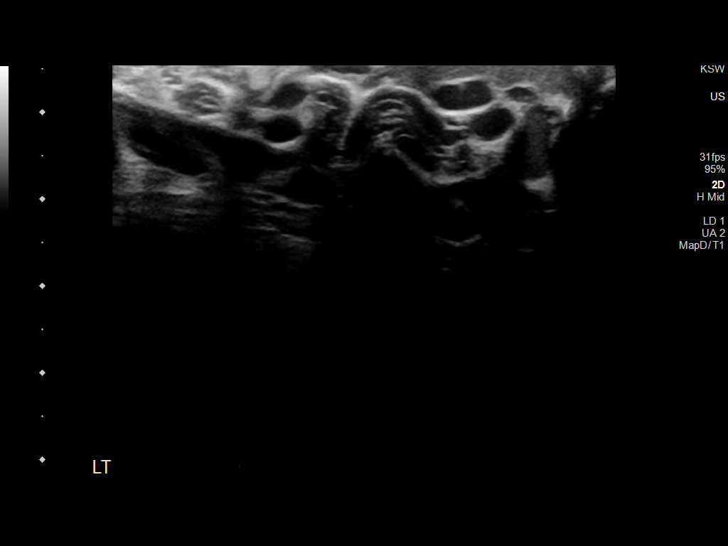
[im 115/153]
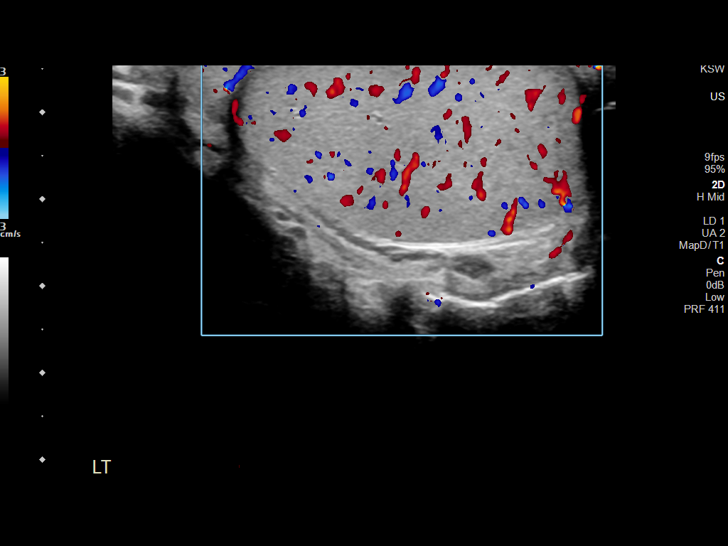
[im 127/153]
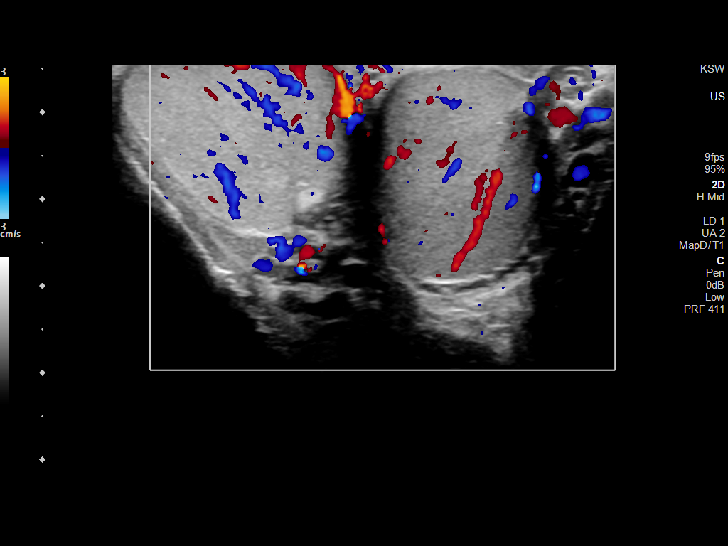
[im 140/153]
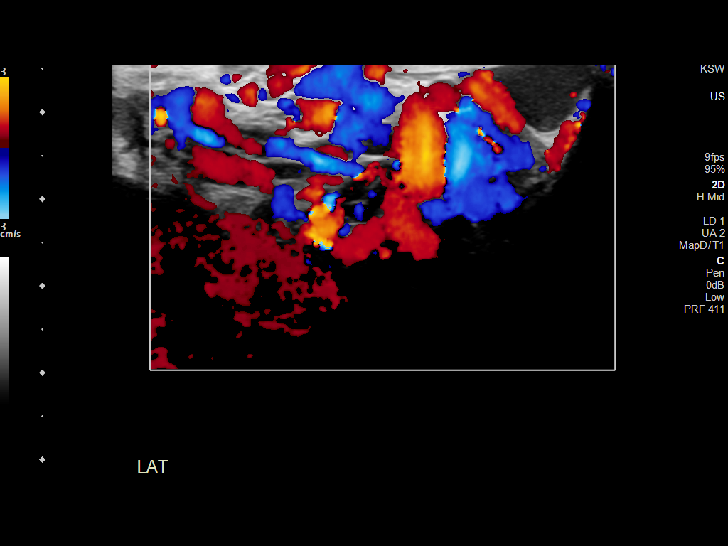
[im 153/153]
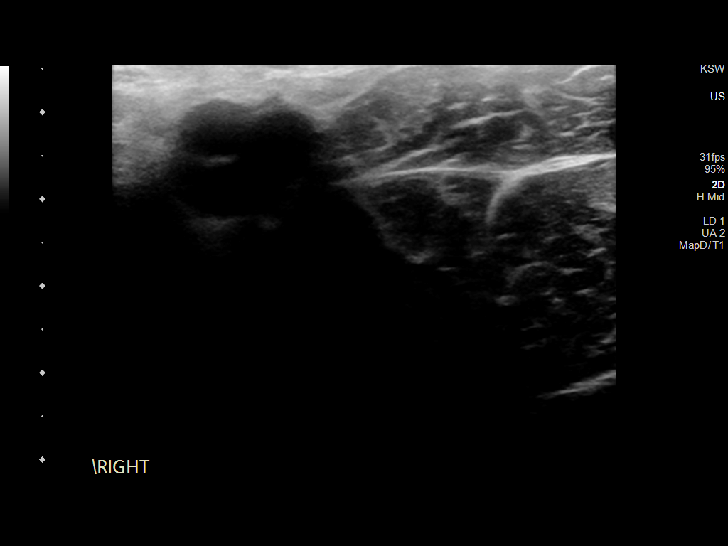

[13 of 25 positions shown; findings below may reference images not displayed]

FINDINGS: Right testicle

Measurements: 4.4 x 2.1 x 2.7 cm. Normal parenchymal echogenicity.
No concerning testicular mass. No evidence of testicular rupture or
hematoma. Few punctate microcalcifications present throughout the
right testicle.

Left testicle

Measurements: 4.0 x 2.2 x 2.5 cm. Normal parenchymal echogenicity.
No concerning testicular mass. No evidence of testicular rupture or
hematoma.

Right epididymis:  1.2 x 0.9 x 1.9 cm, normal size and appearance

Left epididymis:  1.3 x 0.7 x 1.6 cm, normal size and appearance

Hydrocele:  None visualized.

Varicocele:  Mild left varicocele, no right varicocele.

Color and pulsed Doppler interrogation of both testes demonstrates
brisk color Doppler flow which is uniform between the testicles.
Normal low resistance arterial and venous waveforms are demonstrated
bilaterally.
IMPRESSION: No evidence of testicular rupture or hematoma.

Normal pulsed Doppler evaluation of the testes with low resistance
waveforms.

Right testicular microlithiasis. Current literature suggests that
testicular microlithiasis is not a significant independent risk
factor for development of testicular carcinoma, and that follow up
imaging is not warranted in the absence of other risk factors.
Monthly testicular self-examination and annual physical exams are
considered appropriate surveillance. If patient has other risk
factors for testicular carcinoma, then referral to Urology should be
considered. (Reference: Ichilog, et al.: A 5-Year Follow up Study
of Asymptomatic Men with Testicular Microlithiasis. J Urol 9660;
[id number].)

Left varicocele.

These results were called by telephone at the time of interpretation
on 01/31/2020 at [DATE] to provider BOUCETTA FATIHA , who verbally
acknowledged these results.

## 2021-10-23 IMAGING — DX DG HAND COMPLETE 3+V*R*
3 series · 3 of 3 positions shown · non-contrast
Comparison: None.

CLINICAL DATA: Fall down stairs yesterday with fifth ray pain and
swelling

EXAM:
RIGHT HAND - COMPLETE 3+ VIEW

[hand pa]
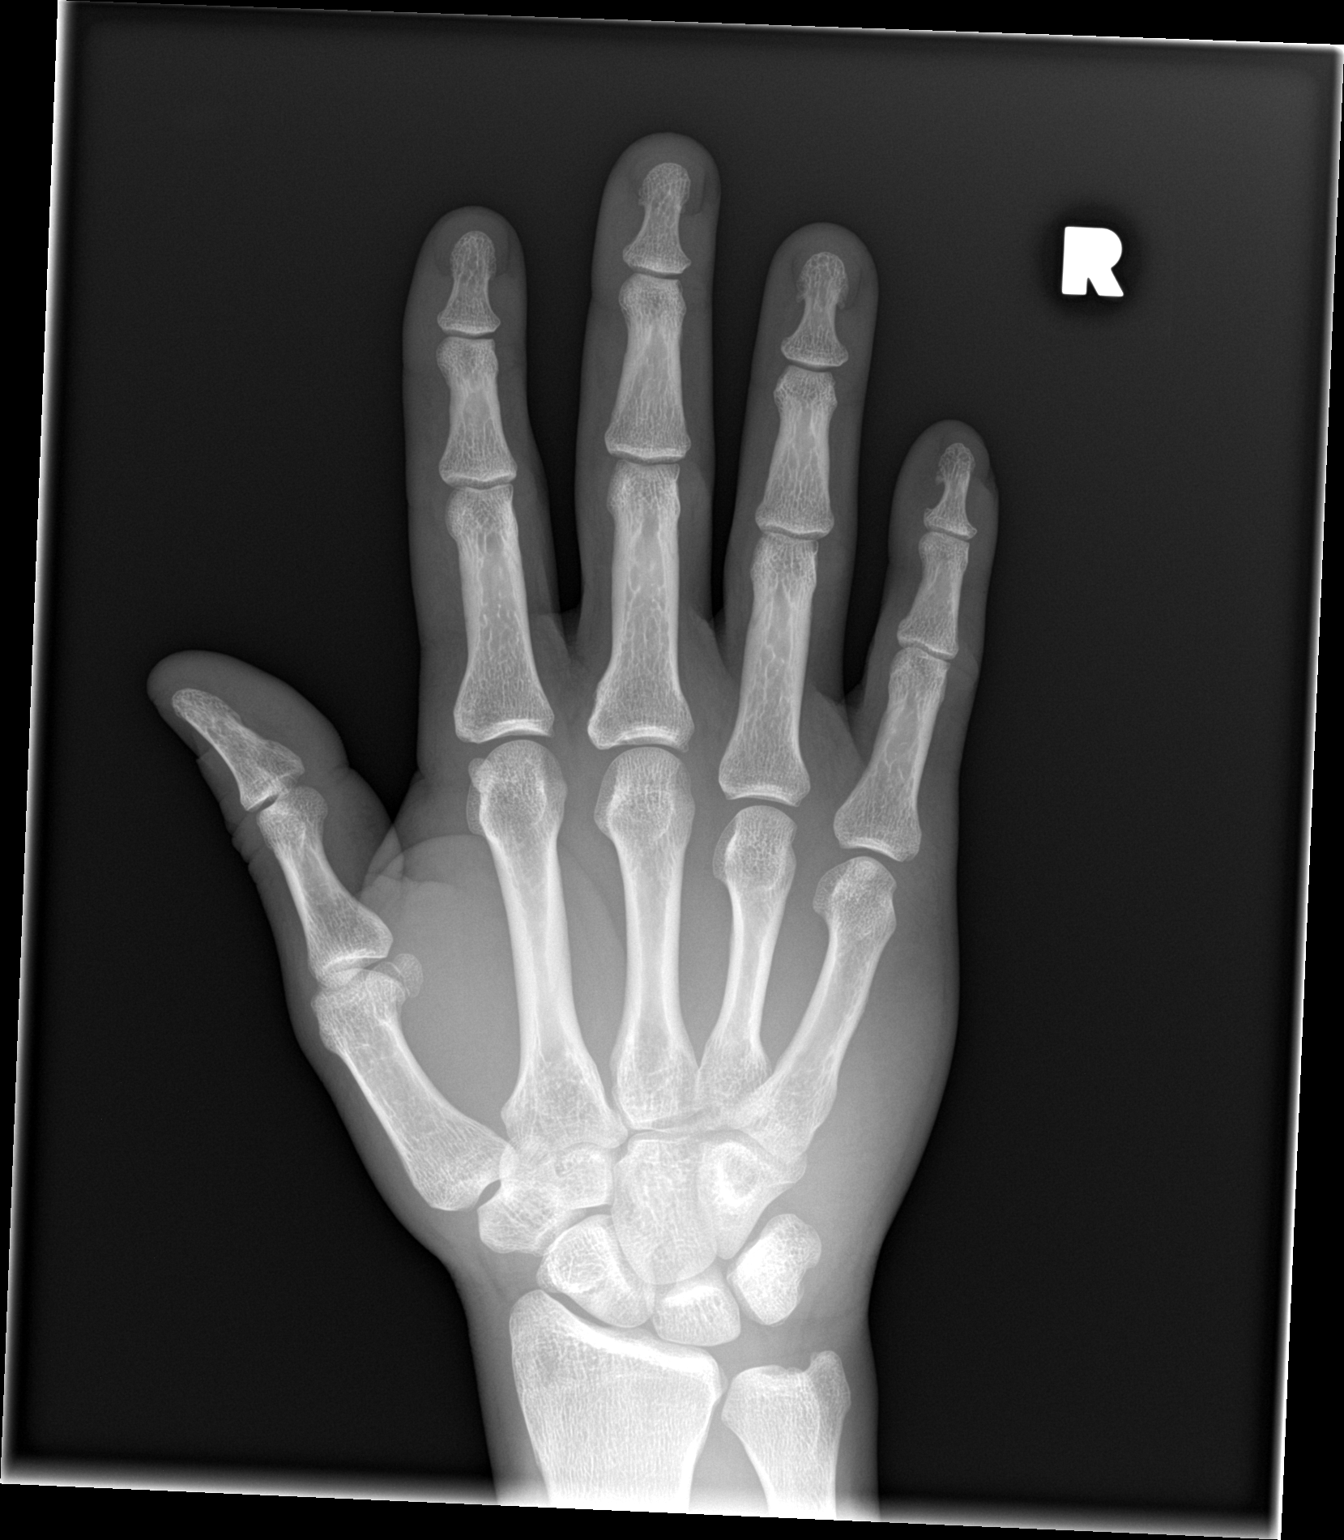

[hand obl]
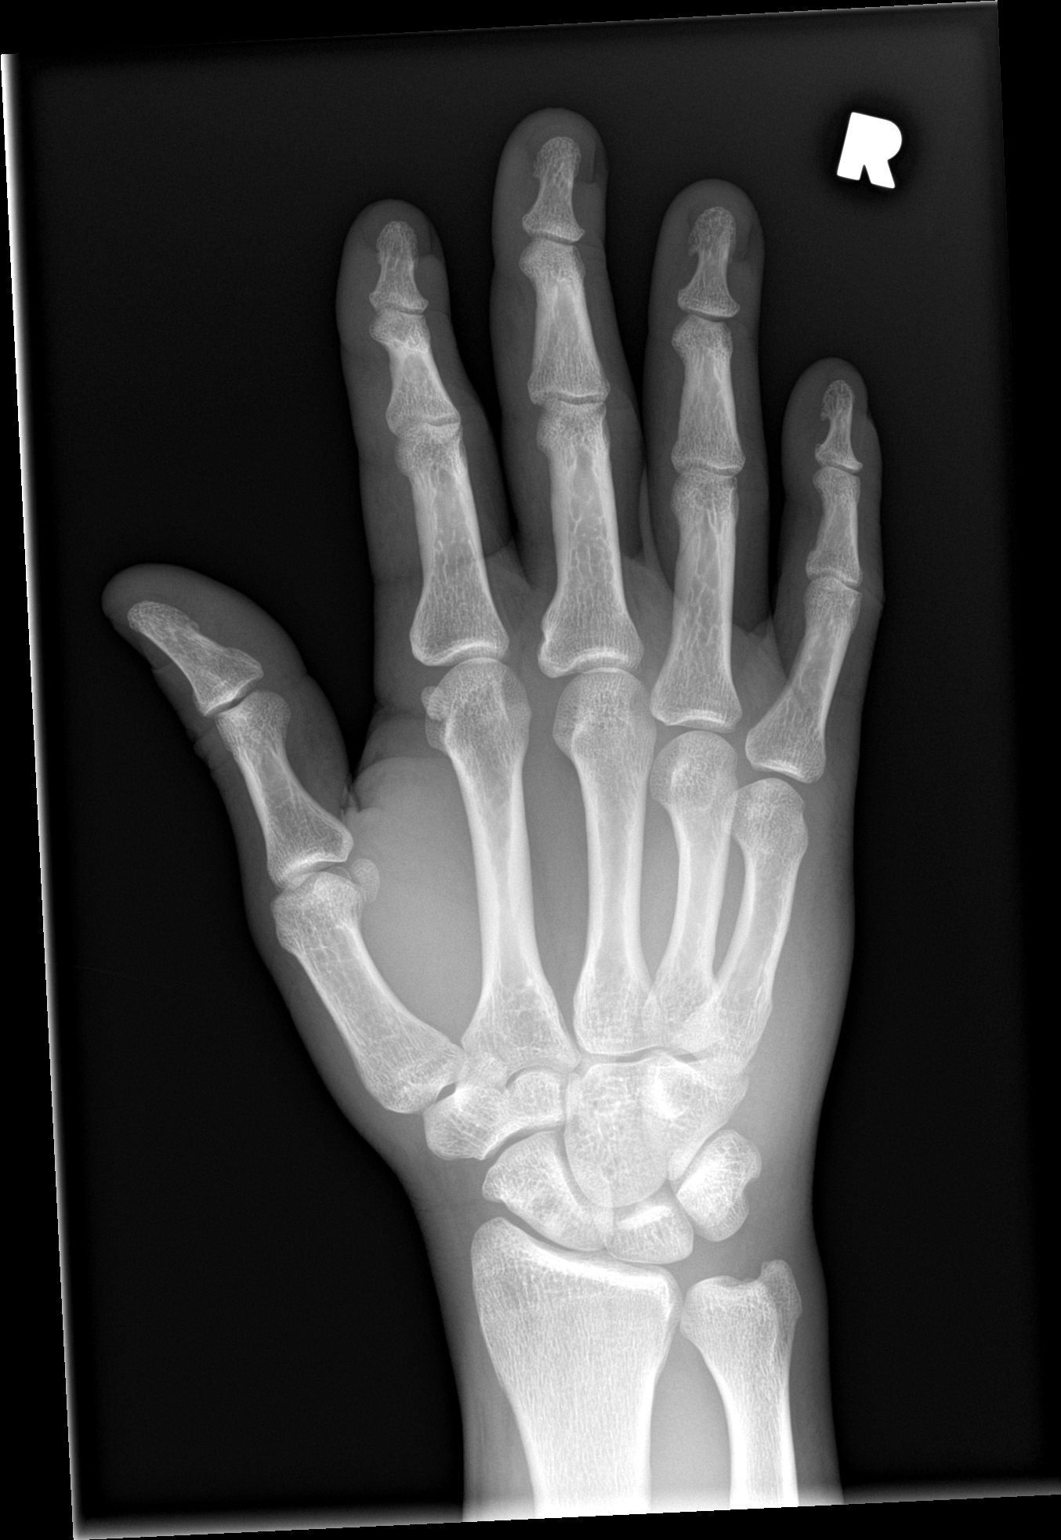

[hand lat]
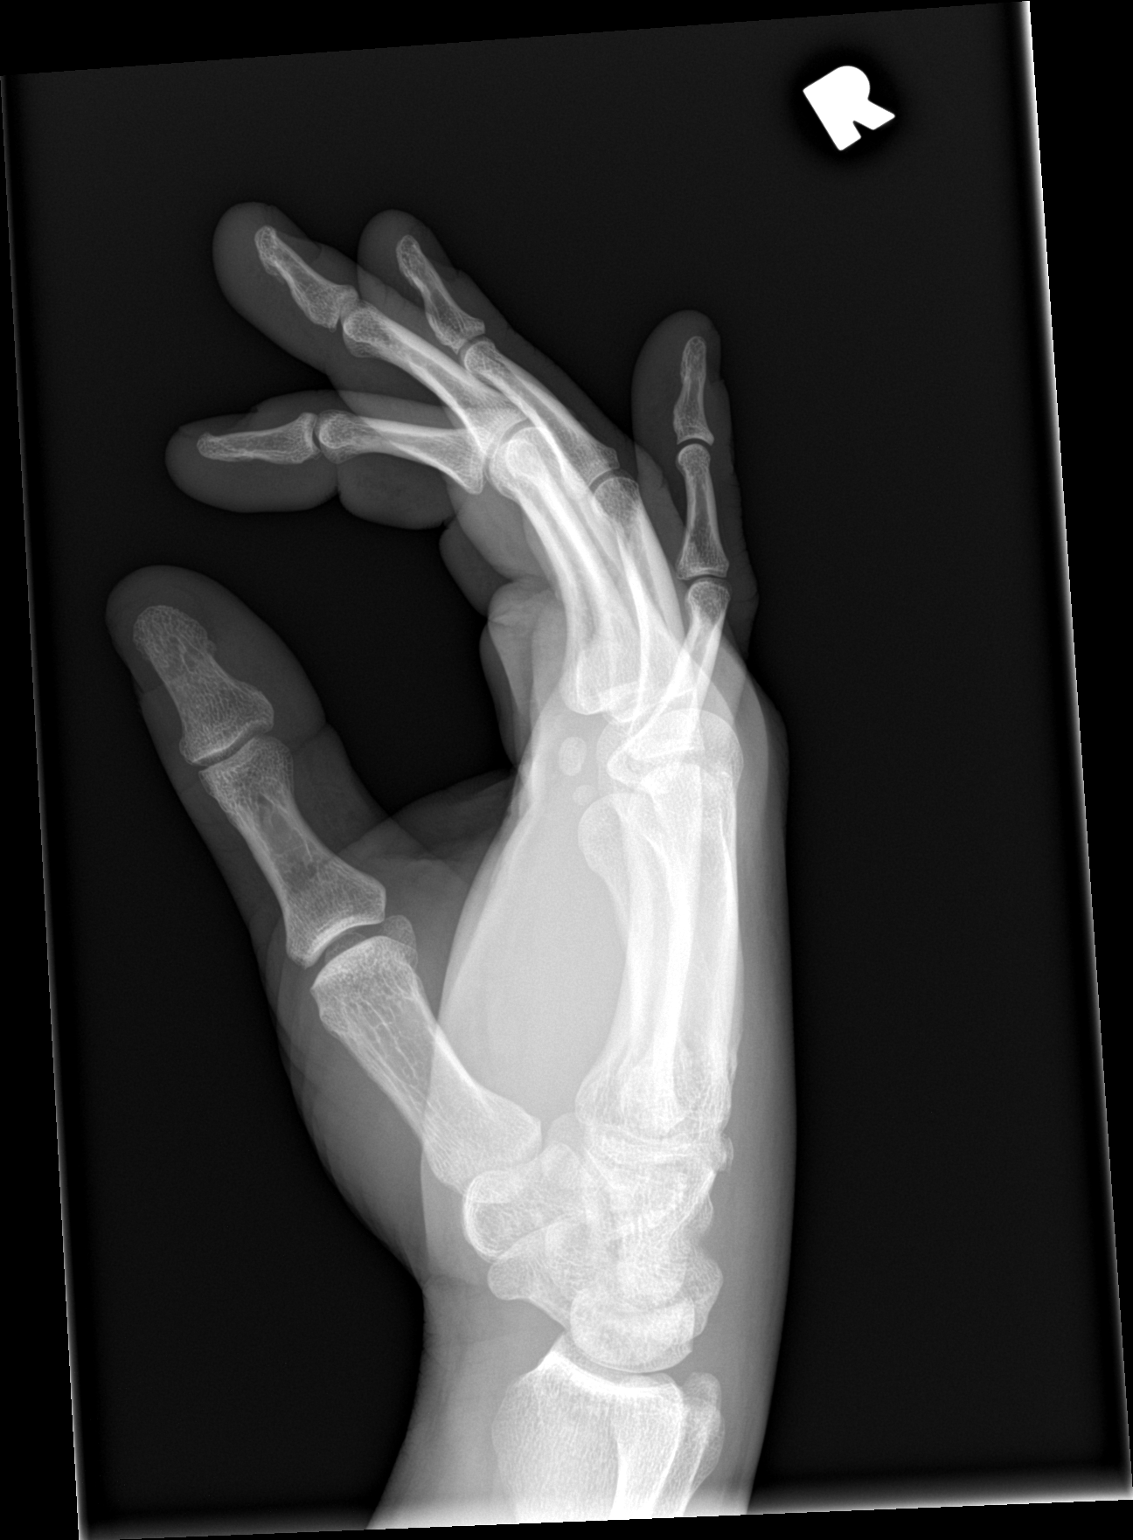

[3 of 3 positions shown; findings below may reference images not displayed]

FINDINGS: No fracture or dislocation. No suspicious focal osseous lesions. No
significant arthropathy. No radiopaque foreign bodies. Hypothenar
right hand soft tissue swelling.
IMPRESSION: No fracture or malalignment. Hypothenar right hand soft tissue
swelling.

## 2022-09-21 ENCOUNTER — Other Ambulatory Visit (HOSPITAL_COMMUNITY): Payer: Self-pay | Admitting: Physical Medicine and Rehabilitation

## 2022-09-21 DIAGNOSIS — M25512 Pain in left shoulder: Secondary | ICD-10-CM

## 2022-09-30 ENCOUNTER — Ambulatory Visit (HOSPITAL_COMMUNITY)
Admission: RE | Admit: 2022-09-30 | Discharge: 2022-09-30 | Disposition: A | Discharge status: Home / Self Care | Payer: No Typology Code available for payment source | Source: Ambulatory Visit | Attending: Physical Medicine and Rehabilitation | Admitting: Physical Medicine and Rehabilitation

## 2022-09-30 DIAGNOSIS — G8929 Other chronic pain: Secondary | ICD-10-CM | POA: Insufficient documentation

## 2022-09-30 DIAGNOSIS — M25512 Pain in left shoulder: Secondary | ICD-10-CM

## 2022-09-30 MED ORDER — METHYLPREDNISOLONE ACETATE 40 MG/ML IJ SUSP
INTRAMUSCULAR | Status: AC
  Filled 2022-09-30: qty 1

## 2022-09-30 MED ORDER — METHYLPREDNISOLONE ACETATE 40 MG/ML INJ SUSP (RADIOLOG
40.0000 mg | Freq: Once | INTRAMUSCULAR | Status: AC
  Administered 2022-09-30: 40 mg via INTRA_ARTICULAR

## 2022-09-30 MED ORDER — BUPIVACAINE HCL (PF) 0.25 % IJ SOLN
5.0000 mL | Freq: Once | INTRAMUSCULAR | Status: AC
  Administered 2022-09-30: 5 mL via INTRA_ARTICULAR

## 2022-09-30 MED ORDER — BUPIVACAINE HCL (PF) 0.25 % IJ SOLN
INTRAMUSCULAR | Status: AC
  Filled 2022-09-30: qty 30

## 2022-09-30 MED ORDER — LIDOCAINE HCL (PF) 1 % IJ SOLN
5.0000 mL | Freq: Once | INTRAMUSCULAR | Status: AC
  Administered 2022-09-30: 5 mL via INTRADERMAL

## 2022-09-30 MED ORDER — IOHEXOL 180 MG/ML  SOLN
10.0000 mL | Freq: Once | INTRAMUSCULAR | Status: AC | PRN
  Administered 2022-09-30: 10 mL via INTRA_ARTICULAR

## 2022-09-30 NOTE — Procedures (Signed)
PROCEDURE SUMMARY:  Successful fluoroscopic guided left shoulder steroid injection.  No immediate complications.  Pt tolerated well.   EBL = <1 ml  Please see full dictation in imaging section of Epic for procedure details.    Alex Gardener, AGNP-BC 09/30/2022, 2:22 PM

## 2023-08-25 ENCOUNTER — Ambulatory Visit

## 2024-04-16 ENCOUNTER — Other Ambulatory Visit: Payer: Self-pay

## 2024-04-16 ENCOUNTER — Emergency Department (HOSPITAL_BASED_OUTPATIENT_CLINIC_OR_DEPARTMENT_OTHER): Admitting: Radiology

## 2024-04-16 ENCOUNTER — Encounter (HOSPITAL_BASED_OUTPATIENT_CLINIC_OR_DEPARTMENT_OTHER): Payer: Self-pay

## 2024-04-16 ENCOUNTER — Emergency Department (HOSPITAL_BASED_OUTPATIENT_CLINIC_OR_DEPARTMENT_OTHER)
Admission: EM | Admit: 2024-04-16 | Discharge: 2024-04-17 | Disposition: A | Discharge status: Home / Self Care | Attending: Emergency Medicine | Admitting: Emergency Medicine

## 2024-04-16 DIAGNOSIS — U071 COVID-19: Secondary | ICD-10-CM | POA: Insufficient documentation

## 2024-04-16 DIAGNOSIS — R509 Fever, unspecified: Secondary | ICD-10-CM | POA: Diagnosis present

## 2024-04-16 DIAGNOSIS — J45909 Unspecified asthma, uncomplicated: Secondary | ICD-10-CM | POA: Diagnosis not present

## 2024-04-16 HISTORY — DX: Unspecified asthma, uncomplicated: J45.909

## 2024-04-16 LAB — RESP PANEL BY RT-PCR (RSV, FLU A&B, COVID)  RVPGX2
Influenza A by PCR: NEGATIVE
Influenza B by PCR: NEGATIVE
Resp Syncytial Virus by PCR: NEGATIVE
SARS Coronavirus 2 by RT PCR: POSITIVE — AB

## 2024-04-16 LAB — GROUP A STREP BY PCR: Group A Strep by PCR: NOT DETECTED

## 2024-04-16 NOTE — ED Triage Notes (Signed)
 Pt reports fatigue, chills, non-productive cough, fever, and ear pressure in R ear, ongoing x3-5 days. Reports 103 fever at home, took ibuprofen ~3 hours PTA.

## 2024-04-17 NOTE — Discharge Instructions (Addendum)
 You were seen today for upper respiratory symptoms.  You tested positive for COVID-19.  Refer to the [facility] website for precautions regarding quarantine.  Generally speaking, do not go back to work until fever free for 24 hours.  You may take Tylenol  or ibuprofen  for fevers or pain.  Make sure that you are staying hydrated.

## 2024-04-17 NOTE — ED Notes (Signed)
 Dc instructions given, pt verbalized understanding out of ED with steady gait, not in visible distress.

## 2024-04-17 NOTE — ED Provider Notes (Signed)
 [place] EMERGENCY DEPARTMENT AT [facility] Provider Note   CSN: [id number] Arrival date & time: 04/16/24  2205     Patient presents with: Cough and Fever   Benjamin Bowen is a 38 y.o. male.   HPI     This is a 38 year old male who presents with generalized fatigue, chills, cough.  Reports fever up to 103.  He has been having some right ear pain as well.  Onset of symptoms 3 to 5 days ago.  Has a fianc with similar symptoms.  Cough has been nonproductive.  Has not really taken anything for his symptoms.  Prior to Admission medications   Medication Sig Start Date End Date Taking? Authorizing Provider  buPROPion  (WELLBUTRIN  XL) 150 MG 24 hr tablet Take 1 tablet (150 mg total) by mouth daily. 03/10/20   Wonda Clarita BRAVO, NP    Allergies: Patient has no known allergies.    Review of Systems  Constitutional:  Positive for chills and fever.  HENT:  Positive for ear pain.   Respiratory:  Positive for cough.   All other systems reviewed and are negative.   Updated Vital Signs BP 115/85   Pulse 86   Temp 98.1 F (36.7 C)   Resp 18   Ht 1.778 m (5' 10)   Wt 95.3 kg   SpO2 98%   BMI 30.13 kg/m   Physical Exam Vitals and nursing note reviewed.  Constitutional:      Appearance: He is well-developed.  HENT:     Head: Normocephalic and atraumatic.     Ears:     Comments: Right TM slightly erythematous but nonbulging and intact light reflex, left TM normal    Nose: Congestion present.     Mouth/Throat:     Mouth: Mucous membranes are moist.     Comments: Uvula midline, no exudate Eyes:     Pupils: Pupils are equal, round, and reactive to light.  Cardiovascular:     Rate and Rhythm: Normal rate and regular rhythm.     Heart sounds: Normal heart sounds. No murmur heard. Pulmonary:     Effort: Pulmonary effort is normal. No respiratory distress.     Breath sounds: Normal breath sounds. No wheezing.  Abdominal:     General: Bowel sounds are normal.      Palpations: Abdomen is soft.     Tenderness: There is no abdominal tenderness. There is no rebound.  Musculoskeletal:     Cervical back: Neck supple.  Lymphadenopathy:     Cervical: No cervical adenopathy.  Skin:    General: Skin is warm and dry.  Neurological:     Mental Status: He is alert and oriented to person, place, and time.  Psychiatric:        Mood and Affect: Mood normal.     (all labs ordered are listed, but only abnormal results are displayed) Labs Reviewed  RESP PANEL BY RT-PCR (RSV, FLU A&B, COVID)  [id number] - Abnormal; Notable for the following components:      Result Value   SARS Coronavirus 2 by RT PCR POSITIVE (*)    All other components within normal limits  GROUP A STREP BY PCR    EKG: None  Radiology: DG Chest 2 View Result Date: 04/16/2024 EXAM: 2 VIEW(S) XRAY OF THE CHEST 04/16/2024 10:50:00 PM COMPARISON: Chest x-ray dated 12/10/2023. CLINICAL HISTORY: Cough. FINDINGS: LUNGS AND PLEURA: No focal pulmonary opacity. No pulmonary edema. No pleural effusion. No pneumothorax. HEART AND MEDIASTINUM: No acute abnormality  of the cardiac and mediastinal silhouettes. BONES AND SOFT TISSUES: No acute osseous abnormality. IMPRESSION: 1. No acute process. Electronically signed by: Greig Pique MD 04/16/2024 10:58 PM EST RP Workstation: [id number]     Procedures   Medications Ordered in the ED - No data to display                                  Medical Decision Making Amount and/or Complexity of Data Reviewed Radiology: ordered.   This patient presents to the ED for concern of upper respiratory symptoms, this involves an extensive number of treatment options, and is a complaint that carries with it a high risk of complications and morbidity.  I considered the following differential and admission for this acute, potentially life threatening condition.  The differential diagnosis includes viral infection such as COVID or influenza, otitis media, pneumonia  MDM:     This is a 38 year old male who presents with upper respiratory symptoms and ear pain.  He is nontoxic.  Vital signs here are reassuring.  Positive COVID testing.  No evidence of otitis media.  Chest x-ray negative for pneumonia.  Discussed supportive measures.  Remote history of asthma but has not had any hospitalizations and is not on any maintenance medications.  No indication for antivirals or antibiotics.  (Labs, imaging, consults)  Labs: I Ordered, and personally interpreted labs.  The pertinent results include: COVID, flu  Imaging Studies ordered: I ordered imaging studies including chest x-ray I independently visualized and interpreted imaging. I agree with the radiologist interpretation  Additional history obtained from chart review.  External records from outside source obtained and reviewed including prior evaluations  Cardiac Monitoring: The patient was not maintained on a cardiac monitor.  If on the cardiac monitor, I personally viewed and interpreted the cardiac monitored which showed an underlying rhythm of: N/A  Reevaluation: After the interventions noted above, I reevaluated the patient and found that they have :stayed the same  Social Determinants of Health:  lives independently  Disposition: Discharge  Co morbidities that complicate the patient evaluation  Past Medical History:  Diagnosis Date   Asthma    Medical history non-contributory      Medicines No orders of the defined types were placed in this encounter.   I have reviewed the patients home medicines and have made adjustments as needed  Problem List / ED Course: Problem List Items Addressed This Visit   None Visit Diagnoses       COVID-19    -  Primary                Final diagnoses:  COVID-19    ED Discharge Orders     None          Sylver Vantassell, Charmaine FALCON, MD 04/17/24 [phone]

## 2024-06-13 ENCOUNTER — Emergency Department (HOSPITAL_BASED_OUTPATIENT_CLINIC_OR_DEPARTMENT_OTHER)
Admission: EM | Admit: 2024-06-13 | Discharge: 2024-06-13 | Disposition: A | Discharge status: Home / Self Care | Attending: Emergency Medicine | Admitting: Emergency Medicine

## 2024-06-13 ENCOUNTER — Other Ambulatory Visit: Payer: Self-pay

## 2024-06-13 ENCOUNTER — Emergency Department (HOSPITAL_BASED_OUTPATIENT_CLINIC_OR_DEPARTMENT_OTHER): Admitting: Radiology

## 2024-06-13 DIAGNOSIS — S60221A Contusion of right hand, initial encounter: Secondary | ICD-10-CM | POA: Insufficient documentation

## 2024-06-13 DIAGNOSIS — Y9339 Activity, other involving climbing, rappelling and jumping off: Secondary | ICD-10-CM | POA: Diagnosis not present

## 2024-06-13 DIAGNOSIS — S6991XA Unspecified injury of right wrist, hand and finger(s), initial encounter: Secondary | ICD-10-CM | POA: Diagnosis present
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
## 2024-06-13 NOTE — Discharge Instructions (Addendum)
 No signs of broken bone today.  You can take Tylenol  or ibuprofen as needed and ice the area.
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
## 2024-06-20 ENCOUNTER — Other Ambulatory Visit: Payer: Self-pay

## 2024-06-20 ENCOUNTER — Encounter (HOSPITAL_BASED_OUTPATIENT_CLINIC_OR_DEPARTMENT_OTHER): Payer: Self-pay | Admitting: Emergency Medicine

## 2024-06-20 ENCOUNTER — Emergency Department (HOSPITAL_BASED_OUTPATIENT_CLINIC_OR_DEPARTMENT_OTHER)
Admission: EM | Admit: 2024-06-20 | Discharge: 2024-06-20 | Disposition: A | Discharge status: Home / Self Care | Attending: Emergency Medicine | Admitting: Emergency Medicine

## 2024-06-20 DIAGNOSIS — H748X3 Other specified disorders of middle ear and mastoid, bilateral: Secondary | ICD-10-CM | POA: Insufficient documentation

## 2024-06-20 DIAGNOSIS — J069 Acute upper respiratory infection, unspecified: Secondary | ICD-10-CM | POA: Insufficient documentation

## 2024-06-20 DIAGNOSIS — R5381 Other malaise: Secondary | ICD-10-CM

## 2024-06-20 MED ORDER — IBUPROFEN 400 MG PO TABS
600.0000 mg | ORAL_TABLET | Freq: Once | ORAL | Status: AC
  Administered 2024-06-20: 600 mg via ORAL
  Filled 2024-06-20: qty 1

## 2024-06-20 NOTE — Discharge Instructions (Signed)
 Please read and follow all provided instructions.  Your diagnoses today include:  1. Malaise and fatigue   2. Upper respiratory tract infection, unspecified type    You appear to have an upper respiratory infection (URI). An upper respiratory tract infection, or cold, is a viral infection of the air passages leading to the lungs. It should improve gradually after 5-7 days. You may have a lingering cough that lasts for 2- 4 weeks after the infection.  Tests performed today include: Vital signs. See below for your results today.   Medications prescribed:  Please use over-the-counter NSAID medications (ibuprofen , naproxen) or Tylenol  (acetaminophen ) as directed on the packaging for pain -- as long as you do not have any reasons avoid these medications. Reasons to avoid NSAID medications include: weak kidneys, a history of bleeding in your stomach or gut, or uncontrolled high blood pressure or previous heart attack. Reasons to avoid Tylenol  include: liver problems or ongoing alcohol use. Never take more than 4000mg  or 8 Extra strength Tylenol  in a 24 hour period.     Take any prescribed medications only as directed. Treatment for your infection is aimed at treating the symptoms. There are no medications, such as antibiotics, that will cure your infection.   Home care instructions:  You can take Tylenol  and/or Ibuprofen  as directed on the packaging for fever reduction and pain relief.    For cough: honey 1/2 to 1 teaspoon (you can dilute the honey in water or another fluid).  You can also use guaifenesin and dextromethorphan for cough. You can use a humidifier for chest congestion and cough.  If you don't have a humidifier, you can sit in the bathroom with the hot shower running.      For sore throat: try warm salt water gargles, cepacol lozenges, throat spray, warm tea or water with lemon/honey, popsicles or ice, or OTC cold relief medicine for throat discomfort.    For congestion: take a daily  anti-histamine like Zyrtec, Claritin, and a oral decongestant, such as pseudoephedrine.  You can also use Flonase 1-2 sprays in each nostril daily.    It is important to stay hydrated: drink plenty of fluids (water, gatorade/powerade/pedialyte, juices, or teas) to keep your throat moisturized and help further relieve irritation/discomfort.   Your illness is contagious and can be spread to others, especially during the first 3 or 4 days. It cannot be cured by antibiotics or other medicines. Take basic precautions such as washing your hands often, covering your mouth when you cough or sneeze, and avoiding public places where you could spread your illness to others.   Please continue drinking plenty of fluids.  Use over-the-counter medicines as needed as directed on packaging for symptom relief.  You may also use ibuprofen  or tylenol  as directed on packaging for pain or fever.  Do not take multiple medicines containing Tylenol  or acetaminophen  to avoid taking too much of this medication.  Follow-up instructions: Please follow-up with your primary care provider in the next 5 days for further evaluation of your symptoms if you are not feeling better.   Return instructions:  Please return to the Emergency Department if you experience worsening symptoms.  RETURN IMMEDIATELY IF you develop shortness of breath, confusion or altered mental status, a new rash, become dizzy, faint, or poorly responsive, or are unable to be cared for at home. Please return if you have persistent vomiting and cannot keep down fluids or develop a fever that is not controlled by tylenol  or motrin .  Please return if you have any other emergent concerns.  Additional Information:  Your vital signs today were: BP 106/74   Pulse 74   Temp 98.7 F (37.1 C)   Resp 16   SpO2 100%  If your blood pressure (BP) was elevated above 135/85 this visit, please have this repeated by your doctor within one month. --------------

## 2024-06-20 NOTE — ED Triage Notes (Signed)
 Reports flu like symptoms starting this morning. Reports dizziness, increased fatigue, and body aches.

## 2024-06-20 NOTE — ED Notes (Signed)
 Was given fluids for trial

## 2024-06-20 NOTE — ED Notes (Signed)
 Reviewed AVS/discharge instruction with patient. Time allotted for and all questions answered. Patient is agreeable for d/c and escorted to ed exit by staff.

## 2024-06-20 NOTE — ED Provider Notes (Signed)
 " Benjamin Bowen AT [facility] Provider Note   CSN: [id number] Arrival date & time: 06/20/24  9044     Patient presents with: Dizziness   Benjamin Bowen is a 39 y.o. male.   Patient with no significant past medical history, denies chronic medications --presents to the emergency Bowen today for malaise.  Patient states that his birthday was yesterday.  He went out for dinner last night.  States that he had 2 alcoholic beverages but did not drink heavily.  After he awoke this morning, he felt very fatigued and tired.  He felt dizzy, described as a lightheadedness and generalized weakness.  No vertigo.  He has had bilateral red eyes with some drainage that is mild.  Reports some stopped up ears.  No coughing or shortness of breath.  No chest pain.  He denies a fever.  No treatments prior to arrival.  He has some generalized aches.       Prior to Admission medications  Medication Sig Start Date End Date Taking? Authorizing Provider  buPROPion  (WELLBUTRIN  XL) 150 MG 24 hr tablet Take 1 tablet (150 mg total) by mouth daily. 03/10/20   Wonda Clarita BRAVO, NP    Allergies: Patient has no known allergies.    Review of Systems  Updated Vital Signs BP 103/70 (BP Location: Right Arm)   Pulse 69   Temp 98.7 F (37.1 C)   Resp 16   SpO2 99%   Physical Exam Vitals and nursing note reviewed.  Constitutional:      Appearance: He is well-developed.  HENT:     Head: Normocephalic and atraumatic.     Jaw: No trismus.     Right Ear: Ear canal and external ear normal. A middle ear effusion is present.     Left Ear: Ear canal and external ear normal. A middle ear effusion is present.     Ears:     Comments: Small air bubbles noted behind the TM bilaterally, no erythema of the TM, although slightly dark.  Consistent with middle ear effusion.  Do not suspect otitis media at this time.    Nose: Congestion present. No mucosal edema or rhinorrhea.     Mouth/Throat:      Mouth: Mucous membranes are not dry.     Pharynx: Uvula midline. No oropharyngeal exudate, posterior oropharyngeal erythema or uvula swelling.     Tonsils: No tonsillar abscesses.  Eyes:     General:        Right eye: No discharge.        Left eye: No discharge.     Conjunctiva/sclera:     Right eye: Right conjunctiva is injected. No chemosis or exudate.    Left eye: Left conjunctiva is injected. No chemosis or exudate.    Slit lamp exam:    Right eye: No photophobia.     Left eye: No photophobia.     Comments: Mild bilateral conjunctival injection consistent with a viral conjunctivitis.  Minimal drainage.  Pupils are normal.  Cardiovascular:     Rate and Rhythm: Normal rate and regular rhythm.     Heart sounds: Normal heart sounds.  Pulmonary:     Effort: Pulmonary effort is normal. No respiratory distress.     Breath sounds: Normal breath sounds. No wheezing or rales.     Comments: Lungs are clear to auscultation bilaterally. Abdominal:     Palpations: Abdomen is soft.     Tenderness: There is no abdominal tenderness.  Musculoskeletal:  Cervical back: Normal range of motion and neck supple.  Skin:    General: Skin is warm and dry.  Neurological:     Mental Status: He is alert.     (all labs ordered are listed, but only abnormal results are displayed) Labs Reviewed - No data to display  EKG: None  Radiology: No results found.   Procedures   Medications Ordered in the ED  ibuprofen  (ADVIL ) tablet 600 mg (600 mg Oral Given 06/20/24 1056)   ED Course  Patient seen and examined. History obtained directly from patient.   Labs/EKG: None ordered  Imaging: None ordered  Medications/Fluids: Ordered: Ibuprofen .   Most recent vital signs reviewed and are as follows: BP 103/70 (BP Location: Right Arm)   Pulse 69   Temp 98.7 F (37.1 C)   Resp 16   SpO2 99%   Initial impression: Patient may be developing up an upper respiratory infection with bilateral  conjunctivitis and some middle ear effusion changes noted.  He also has generalized fatigue.  No focal neurologic deficits and he looks well.  Plan is to give ibuprofen  and make sure he is tolerating fluids okay.  Given slightly low blood pressure on arrival, will recheck.  Anticipate discharged home with symptomatic control and rest with close monitoring of symptoms as he is well-appearing.  11:54 AM Reassessment performed. Patient appears stable.  Resting in bed.  No vomiting.  Vitals are stable.  Most current vital signs reviewed and are as follows: BP 106/74   Pulse 74   Temp 98.7 F (37.1 C)   Resp 16   SpO2 100%   Plan: Discharge to home.   Prescriptions written for: None  Other home care instructions discussed: Discussed rest, need for adequate hydration, over-the-counter medications.  Discussed that symptoms can change and vary and to monitor closely for any worsening.  ED return instructions discussed: Discussed need to return with any worsening severe symptoms, difficulty breathing, persistent vomiting.    Follow-up instructions discussed: Patient encouraged to follow-up with their PCP in 5 days if not improving.                                    Medical Decision Making  Patient with nondescript symptoms.  He reports generalized weakness and fatigue.  He does have mild conjunctivitis and middle ear effusions which is suggestive of a URI that starting.  Flu felt to be less likely due to no fever or cough at this time.  Patient is otherwise well-appearing, nontoxic.  Low concern for sepsis.  Lungs are clear do not suspect pneumonia.  Exam not clinically concerning for otitis media or for strep throat.  No GI symptoms.  At this time, recommend symptomatic and supportive care, close monitoring and return with worsening.  Patient seems reliable to return if symptoms become more severe.     Final diagnoses:  Malaise and fatigue  Upper respiratory tract infection, unspecified  type    ED Discharge Orders     None          Desiderio Chew, PA-C 06/20/24 1156    Cottie Donnice PARAS, MD 06/20/24 1231  "
